# Patient Record
Sex: Female | Born: 1940 | Race: White | Hispanic: No | Marital: Married | Smoking: Former smoker
Health system: Southern US, Community
[De-identification: ages and names within clinical notes are randomized; demographics above are authoritative.]

## PROBLEM LIST (undated history)

## (undated) DIAGNOSIS — C801 Malignant (primary) neoplasm, unspecified: Secondary | ICD-10-CM

## (undated) DIAGNOSIS — I10 Essential (primary) hypertension: Secondary | ICD-10-CM

## (undated) DIAGNOSIS — I499 Cardiac arrhythmia, unspecified: Secondary | ICD-10-CM

## (undated) DIAGNOSIS — L570 Actinic keratosis: Secondary | ICD-10-CM

## (undated) DIAGNOSIS — N189 Chronic kidney disease, unspecified: Secondary | ICD-10-CM

## (undated) DIAGNOSIS — Z87442 Personal history of urinary calculi: Secondary | ICD-10-CM

## (undated) HISTORY — PX: TUBAL LIGATION: SHX77

## (undated) HISTORY — PX: TONSILLECTOMY: SUR1361

---
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
## 2011-06-05 ENCOUNTER — Ambulatory Visit: Payer: Self-pay | Admitting: Specialist

## 2011-06-09 ENCOUNTER — Ambulatory Visit: Payer: Self-pay | Admitting: Specialist

## 2011-06-09 LAB — BASIC METABOLIC PANEL
Anion Gap: 7 (ref 7–16)
BUN: 18 mg/dL (ref 7–18)
Chloride: 106 mmol/L (ref 98–107)
Co2: 28 mmol/L (ref 21–32)
Creatinine: 0.61 mg/dL (ref 0.60–1.30)
EGFR (African American): >60
Glucose: 83 mg/dL (ref 65–99)
Potassium: 3.9 mmol/L (ref 3.5–5.1)
Sodium: 141 mmol/L (ref 136–145)

## 2011-06-09 LAB — CBC WITH DIFFERENTIAL/PLATELET
Basophil %: 0.5 %
Eosinophil #: 0.1 10*3/uL (ref 0.0–0.7)
HCT: 42.2 % (ref 35.0–47.0)
HGB: 14 g/dL (ref 12.0–16.0)
Lymphocyte %: 31.1 %
MCH: 30.1 pg (ref 26.0–34.0)
MCHC: 33.1 g/dL (ref 32.0–36.0)
Monocyte #: 0.6 10*3/uL (ref 0.0–0.7)
Monocyte %: 8.1 %
Neutrophil #: 4.1 10*3/uL (ref 1.4–6.5)

## 2011-06-14 ENCOUNTER — Ambulatory Visit: Payer: Self-pay | Admitting: Specialist

## 2012-03-27 HISTORY — PX: SHOULDER ARTHROSCOPY: SHX128

## 2013-11-24 ENCOUNTER — Encounter: Payer: Self-pay | Admitting: Internal Medicine

## 2014-03-09 ENCOUNTER — Encounter: Payer: Self-pay | Admitting: Internal Medicine

## 2014-07-19 NOTE — Op Note (Signed)
PATIENT NAME:  [name], [name] MR#:  [id number] DATE OF BIRTH:  02-22-1941  DATE OF PROCEDURE:  06/14/2011  PREOPERATIVE DIAGNOSES:  1. Severe impingement of right shoulder with large anterior acromial spur.  2. Severe AC joint arthritis.  3. 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 4. Synovitis of right glenohumeral joint.   POSTOPERATIVE DIAGNOSES:  1. Severe impingement of right shoulder with large anterior acromial spur.  2. Severe AC joint arthritis.  3. 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 4. Synovitis of right glenohumeral joint.   PROCEDURES PERFORMED:  1. Arthroscopic resection of right distal clavicle.  2. Arthroscopic subacromial decompression, right shoulder, with removal of large spur.  3. Arthroscopic debridement of the glenohumeral joint.   SURGEON: Park Breed, M.D.   ANESTHESIA: General endotracheal plus interscalene block.  COMPLICATIONS: None.   DRAINS: None.   ESTIMATED BLOOD LOSS: Minimal.  REPLACEMENTS: None.   OPERATIVE FINDINGS: The patient had an intact rotator cuff on the joint side. She had some fraying on the bursal side, but no tear noted. The glenohumeral region showed an intact biceps. There was grade III chondromalacia on the humeral head. The glenoid had minimal changes. There was some synovitis. The labrum had minimal fraying. The subacromial space showed severe advanced bursitis. There was a large anterior acromial spur. There was significant spurring off the [facility] joint.   DESCRIPTION OF PROCEDURE: The patient was brought to the Operating Room where she underwent satisfactory general endotracheal anesthesia after an interscalene block was put in place. She was turned to the left lateral decubitus position and padded appropriately on a beanbag. The right shoulder was prepped and draped in sterile fashion and 10 pounds of traction applied. Arthroscopy was carried  from posterolateral and anterior portals. The above findings were encountered. The glenohumeral joint was carefully debrided with a motorized resector removing frayed tissue. The undersurface of the cuff was explored carefully and was in pristine condition. The arthroscope was redirected in the subacromial space where there was extensive bursitis. This was excised with motorized resector and ArthroCare wand. Soft tissue was debrided off the underside of the acromion and the large bur was used to remove the significant prominence of the anterior acromion. The coracoacromial ligament was debrided fully. The soft tissues were debrided from underneath the distal clavicle and the bur used to remove the entire portion of the distal clavicle. This was done laterally and anteriorly. The bursal surface the rotator cuff was just gently debrided where it was roughened up. After thorough debridement and decompression were carried out, the joint was thoroughly irrigated and the stab wounds closed with 3-0 Vicryl. 0.25% Marcaine with epinephrine and morphine was placed in the bursa, in the joint. A dry sterile dressing and TENS pads were applied. The ice pack and sling were applied. The patient was taken out of traction. She was awakened and taken to the Recovery Room in good condition.  ____________________________ Park Breed, MD hem:slb D: 06/14/2011 15:09:07 ET T: 06/14/2011 15:53:10 ET JOB#: [id number]  cc: Park Breed, MD, <Dictator> Park Breed MD ELECTRONICALLY SIGNED 06/14/2011 16:12

## 2014-08-06 ENCOUNTER — Ambulatory Visit: Payer: Medicare HMO | Admitting: Anesthesiology

## 2014-08-06 ENCOUNTER — Encounter: Payer: Self-pay | Admitting: *Deleted

## 2014-08-06 ENCOUNTER — Encounter
Admission: RE | Disposition: A | Discharge status: Home / Self Care | Payer: Self-pay | Source: Ambulatory Visit | Attending: Unknown Physician Specialty

## 2014-08-06 ENCOUNTER — Ambulatory Visit
Admission: RE | Admit: 2014-08-06 | Discharge: 2014-08-06 | Disposition: A | Discharge status: Home / Self Care | Payer: Medicare HMO | Source: Ambulatory Visit | Attending: Unknown Physician Specialty | Admitting: Unknown Physician Specialty

## 2014-08-06 DIAGNOSIS — Z1211 Encounter for screening for malignant neoplasm of colon: Secondary | ICD-10-CM | POA: Diagnosis not present

## 2014-08-06 DIAGNOSIS — I499 Cardiac arrhythmia, unspecified: Secondary | ICD-10-CM | POA: Diagnosis not present

## 2014-08-06 DIAGNOSIS — I10 Essential (primary) hypertension: Secondary | ICD-10-CM | POA: Insufficient documentation

## 2014-08-06 DIAGNOSIS — K640 First degree hemorrhoids: Secondary | ICD-10-CM | POA: Insufficient documentation

## 2014-08-06 DIAGNOSIS — Z79899 Other long term (current) drug therapy: Secondary | ICD-10-CM | POA: Insufficient documentation

## 2014-08-06 DIAGNOSIS — K5730 Diverticulosis of large intestine without perforation or abscess without bleeding: Secondary | ICD-10-CM | POA: Diagnosis not present

## 2014-08-06 HISTORY — PX: COLONOSCOPY: SHX5424

## 2014-08-06 HISTORY — DX: Cardiac arrhythmia, unspecified: I49.9

## 2014-08-06 HISTORY — DX: Essential (primary) hypertension: I10

## 2014-08-06 SURGERY — COLONOSCOPY
Anesthesia: Monitor Anesthesia Care

## 2014-08-06 MED ORDER — PROPOFOL INFUSION 10 MG/ML OPTIME
INTRAVENOUS | Status: DC | PRN
  Administered 2014-08-06: 140 ug/kg/min via INTRAVENOUS

## 2014-08-06 MED ORDER — SODIUM CHLORIDE 0.9 % IV SOLN: INTRAVENOUS | Status: DC

## 2014-08-06 MED ORDER — FENTANYL CITRATE (PF) 100 MCG/2ML IJ SOLN
INTRAMUSCULAR | Status: DC | PRN
  Administered 2014-08-06: 50 ug via INTRAVENOUS

## 2014-08-06 MED ORDER — SODIUM CHLORIDE 0.9 % IV SOLN
INTRAVENOUS | Status: DC
  Administered 2014-08-06: 15:00:00 via INTRAVENOUS

## 2014-08-06 MED ORDER — GLYCOPYRROLATE 0.2 MG/ML IJ SOLN
INTRAMUSCULAR | Status: DC | PRN
Start: 2014-08-06 — End: 2014-08-06
  Administered 2014-08-06: 0.4 mg via INTRAVENOUS

## 2014-08-06 NOTE — Op Note (Signed)
[facility] Gastroenterology Patient Name: [name] Procedure Date: 08/06/2014 3:23 PM MRN: [id number] Account #: [id number] Date of Birth: 08/09/1940 Admit Type: Outpatient Age: 74 Room: [facility] ENDO ROOM 3 Gender: Female Note Status: Finalized Procedure:         Colonoscopy Indications:       Screening for colorectal malignant neoplasm Providers:         Manya Silvas, MD Referring MD:      Perrin Maltese, MD (Referring MD) Medicines:         Propofol per Anesthesia Complications:     No immediate complications. Procedure:         Pre-Anesthesia Assessment:                    - After reviewing the risks and benefits, the patient was                     deemed in satisfactory condition to undergo the procedure.                    After obtaining informed consent, the colonoscope was                     passed under direct vision. Throughout the procedure, the                     patient's blood pressure, pulse, and oxygen saturations                     were monitored continuously. The Olympus PCF-H180AL                     colonoscope ( S#: [id number] ) was introduced through the                     anus and advanced to the the cecum, identified by                     appendiceal orifice and ileocecal valve. The colonoscopy                     was somewhat difficult due to a tortuous colon. Successful                     completion of the procedure was aided by applying                     abdominal pressure. During the procedure the patient had                     to be repositioned. Patient changed from L lateral to                     supine and this helped. Findings:      Multiple small-mouthed diverticula were found in the sigmoid colon and       in the descending colon.      Internal hemorrhoids were found during endoscopy. The hemorrhoids were       small and Grade I (internal hemorrhoids that do not prolapse).      No polyps seen. Impression:         - Diverticulosis in the sigmoid colon and in the  descending colon.                    - Internal hemorrhoids.                    - No specimens collected. Recommendation:    - The findings and recommendations were discussed with the                     patient's family. Manya Silvas, MD 08/06/2014 3:55:22 PM This report has been signed electronically. Number of Addenda: 0 Note Initiated On: 08/06/2014 3:23 PM Scope Withdrawal Time: 0 hours 7 minutes 55 seconds  Total Procedure Duration: 0 hours 20 minutes 17 seconds       [facility]

## 2014-08-06 NOTE — Anesthesia Procedure Notes (Signed)
Procedure Name: MAC Performed by: Jonna Clark Pre-anesthesia Checklist: Patient identified, Emergency Drugs available, Suction available, Patient being monitored and Timeout performed Patient Re-evaluated:Patient Re-evaluated prior to inductionOxygen Delivery Method: Nasal cannula Preoxygenation: Pre-oxygenation with 100% oxygen

## 2014-08-06 NOTE — Anesthesia Postprocedure Evaluation (Signed)
  Anesthesia Post-op Note  Patient: [name]  Procedure(s) Performed: Procedure(s): COLONOSCOPY (N/A)  Anesthesia type:MAC  Patient locationjEndo  Post pain: Pain level controlled  Post assessment: Post-op Vital signs reviewed, Patient's Cardiovascular Status Stable, Respiratory Function Stable, Patent Airway and No signs of Nausea or vomiting  Post vital signs: Reviewed and stable  Last Vitals:  Filed Vitals:   08/06/14 1558  BP: 107/55  Pulse: 74  Temp: 36.8 C  Resp: 15    Level of consciousness: awake, alert  and patient cooperative  Complications: No apparent anesthesia complications

## 2014-08-06 NOTE — H&P (Signed)
     Primary Care Physician:  Perrin Maltese, MD Primary Gastroenterologist:  Dr. Vira Agar  Pre-Procedure History & Physical: HPI:  [name] is a 74 y.o. female is here for a screening colonoscopy.   Past Medical History  Diagnosis Date  . Hypertension   . Dysrhythmia     Past Surgical History  Procedure Laterality Date  . Tonsillectomy    . Tubal ligation      Prior to Admission medications   Medication Sig Start Date End Date Taking? Authorizing Provider  cholecalciferol (VITAMIN D) 1000 UNITS tablet Take 1,000 Units by mouth daily.   Yes Historical Provider, MD  diphenhydrAMINE (BENADRYL) 25 MG tablet Take 50 mg by mouth every 6 (six) hours as needed for allergies.   Yes Historical Provider, MD  ibuprofen (ADVIL,MOTRIN) 200 MG tablet Take 400 mg by mouth every 6 (six) hours as needed for headache or moderate pain.   Yes Historical Provider, MD  lisinopril (PRINIVIL,ZESTRIL) 20 MG tablet Take 20 mg by mouth daily.   Yes Historical Provider, MD  lovastatin (MEVACOR) 20 MG tablet Take 20 mg by mouth at bedtime.   Yes Historical Provider, MD  metoprolol (LOPRESSOR) 50 MG tablet Take 50 mg by mouth 2 (two) times daily.   Yes Historical Provider, MD    Allergies as of 07/10/2014  . (Not on File)     Review of Systems: See HPI, otherwise negative ROS  Physical Exam: BP 157/70 mmHg  Pulse 60  Temp(Src) 97.8 F (36.6 C) (Tympanic)  Resp 18  Ht 5\' 6"  (1.676 m)  Wt 77.111 kg (170 lb)  BMI 27.45 kg/m2  SpO2 100% General:   Alert,  pleasant and cooperative in NAD Head:  Normocephalic and atraumatic. Neck:  Supple; no masses or thyromegaly. Lungs:  Clear throughout to auscultation.    Heart:  Regular rate and rhythm. Abdomen:  Soft, nontender and nondistended. Normal bowel sounds, without guarding, and without rebound.   Neurologic:  Alert and  oriented x4;  grossly normal neurologically.  Impression/Plan: [name] is now here to undergo a screening  colonoscopy.  Risks, benefits, and alternatives regarding colonoscopy have been reviewed with the patient.  Questions have been answered.  All parties agreeable.   Gaylyn Cheers , MD

## 2014-08-06 NOTE — Transfer of Care (Signed)
Immediate Anesthesia Transfer of Care Note  Patient: [name]  Procedure(s) Performed: Procedure(s): COLONOSCOPY (N/A)  Patient Location: PACU and Endoscopy Unit  Anesthesia Type:MAC  Level of Consciousness: awake, alert  and oriented  Airway & Oxygen Therapy: Patient Spontanous Breathing and Patient connected to nasal cannula oxygen  Post-op Assessment: Report given to RN and Post -op Vital signs reviewed and stable  Post vital signs: Reviewed and stable  Last Vitals:  Filed Vitals:   08/06/14 1422  BP: 157/70  Pulse: 60  Temp: 36.6 C  Resp: 18    Complications: No apparent anesthesia complications

## 2014-08-06 NOTE — Anesthesia Preprocedure Evaluation (Signed)
Anesthesia Evaluation  Patient identified by MRN, date of birth, ID band  Reviewed: Allergy & Precautions, H&P , NPO status , Patient's Chart, lab work & pertinent test results, reviewed documented beta blocker date and time   Airway Mallampati: II  TM Distance: >3 FB Neck ROM: full    Dental   Pulmonary neg pulmonary ROS, former smoker,  breath sounds clear to auscultation  Pulmonary exam normal       Cardiovascular hypertension, Rhythm:regular Rate:Normal     Neuro/Psych negative neurological ROS  negative psych ROS   GI/Hepatic negative GI ROS, Neg liver ROS,   Endo/Other  negative endocrine ROS  Renal/GU negative Renal ROS  negative genitourinary   Musculoskeletal   Abdominal   Peds  Hematology negative hematology ROS (+)   Anesthesia Other Findings   Reproductive/Obstetrics negative OB ROS                             Anesthesia Physical Anesthesia Plan  ASA: II  Anesthesia Plan: MAC   Post-op Pain Management:    Induction:   Airway Management Planned:   Additional Equipment:   Intra-op Plan:   Post-operative Plan:   Informed Consent: I have reviewed the patients History and Physical, chart, labs and discussed the procedure including the risks, benefits and alternatives for the proposed anesthesia with the patient or authorized representative who has indicated his/her understanding and acceptance.   Dental Advisory Given  Plan Discussed with: CRNA and Surgeon  Anesthesia Plan Comments:         Anesthesia Quick Evaluation

## 2014-08-07 ENCOUNTER — Encounter: Payer: Self-pay | Admitting: Unknown Physician Specialty

## 2015-04-22 DIAGNOSIS — I10 Essential (primary) hypertension: Secondary | ICD-10-CM | POA: Diagnosis not present

## 2015-04-22 DIAGNOSIS — I340 Nonrheumatic mitral (valve) insufficiency: Secondary | ICD-10-CM | POA: Diagnosis not present

## 2015-04-22 DIAGNOSIS — E782 Mixed hyperlipidemia: Secondary | ICD-10-CM | POA: Diagnosis not present

## 2015-04-22 DIAGNOSIS — R002 Palpitations: Secondary | ICD-10-CM | POA: Diagnosis not present

## 2015-06-01 DIAGNOSIS — H2513 Age-related nuclear cataract, bilateral: Secondary | ICD-10-CM | POA: Diagnosis not present

## 2015-06-01 DIAGNOSIS — H5203 Hypermetropia, bilateral: Secondary | ICD-10-CM | POA: Diagnosis not present

## 2015-06-01 DIAGNOSIS — H43313 Vitreous membranes and strands, bilateral: Secondary | ICD-10-CM | POA: Diagnosis not present

## 2015-06-29 DIAGNOSIS — J301 Allergic rhinitis due to pollen: Secondary | ICD-10-CM | POA: Diagnosis not present

## 2015-06-29 DIAGNOSIS — E782 Mixed hyperlipidemia: Secondary | ICD-10-CM | POA: Diagnosis not present

## 2015-06-29 DIAGNOSIS — I10 Essential (primary) hypertension: Secondary | ICD-10-CM | POA: Diagnosis not present

## 2015-07-13 DIAGNOSIS — H3561 Retinal hemorrhage, right eye: Secondary | ICD-10-CM | POA: Diagnosis not present

## 2015-08-19 DIAGNOSIS — I10 Essential (primary) hypertension: Secondary | ICD-10-CM | POA: Diagnosis not present

## 2015-08-19 DIAGNOSIS — E782 Mixed hyperlipidemia: Secondary | ICD-10-CM | POA: Diagnosis not present

## 2015-08-19 DIAGNOSIS — R002 Palpitations: Secondary | ICD-10-CM | POA: Diagnosis not present

## 2015-12-30 DIAGNOSIS — I10 Essential (primary) hypertension: Secondary | ICD-10-CM | POA: Diagnosis not present

## 2015-12-30 DIAGNOSIS — J3089 Other allergic rhinitis: Secondary | ICD-10-CM | POA: Diagnosis not present

## 2015-12-30 DIAGNOSIS — E782 Mixed hyperlipidemia: Secondary | ICD-10-CM | POA: Diagnosis not present

## 2016-03-03 DIAGNOSIS — Z1231 Encounter for screening mammogram for malignant neoplasm of breast: Secondary | ICD-10-CM | POA: Diagnosis not present

## 2017-06-25 DIAGNOSIS — C801 Malignant (primary) neoplasm, unspecified: Secondary | ICD-10-CM

## 2017-06-25 HISTORY — DX: Malignant (primary) neoplasm, unspecified: C80.1

## 2017-07-10 DIAGNOSIS — C4492 Squamous cell carcinoma of skin, unspecified: Secondary | ICD-10-CM

## 2017-07-10 HISTORY — DX: Squamous cell carcinoma of skin, unspecified: C44.92

## 2017-09-19 ENCOUNTER — Telehealth: Payer: Self-pay | Admitting: Urology

## 2017-09-19 NOTE — Telephone Encounter (Signed)
Called patient and told her she needed to get the CT scan put on a CD and bring it to her app or we would need to reschd it. She said she would call her PCP and have this done.  [name]

## 2017-09-19 NOTE — Telephone Encounter (Signed)
I need [name] to bring a disc of her CT scan with her to her appointment.

## 2017-09-19 NOTE — Progress Notes (Signed)
09/20/2017 1:54 PM   [name] [birth year] [id number]  Referring provider: Perrin Maltese, MD [place], [place] [address]  Chief Complaint  Patient presents with  . Nephrolithiasis    HPI: Patient is a 77 year old Caucasian female who is referred by Dr. Humphrey Rolls for nephrolithiasis.    Today, she is experiencing nocturia and incontinence.  Patient denies any gross hematuria, dysuria or suprapubic/flank pain.  Patient denies any fevers, chills, nausea or vomiting.   Her UA today is positive for >30 WBC's, 11-30 RBC's, moderate bacteria and uric acid crystals.    CT scan in 08/2017 at [facility] noted multiple left renal calculi and a 2.6 cm renal pelvic calculus and left renal pyelocaliectasis.  Serum creatinine was 1.07 on 09/11/2017.  Her serum creatinine was 0.61 in 2013.    She states that she was told she had stones in her kidneys 5 years ago, but they were just sand.      PMH: Past Medical History:  Diagnosis Date  . Dysrhythmia   . Hypertension     Surgical History: Past Surgical History:  Procedure Laterality Date  . COLONOSCOPY N/A 08/06/2014   Procedure: COLONOSCOPY;  Surgeon: Manya Silvas, MD;  Location: [facility] ENDOSCOPY;  Service: Endoscopy;  Laterality: N/A;  . TONSILLECTOMY    . TUBAL LIGATION      Home Medications:  Allergies as of 09/20/2017      Reactions   Raloxifene    Other reaction(s): Other (See Comments) hotflashes      Medication List        Accurate as of 09/20/17  1:54 PM. Always use your most recent med list.          cholecalciferol 1000 units tablet Commonly known as:  VITAMIN D Take 1,000 Units by mouth daily.   ibuprofen 200 MG tablet Commonly known as:  ADVIL,MOTRIN Take 400 mg by mouth every 6 (six) hours as needed for headache or moderate pain.   lisinopril 20 MG tablet Commonly known as:  PRINIVIL,ZESTRIL Take 20 mg by mouth daily.   lovastatin 20 MG tablet Commonly known as:  MEVACOR Take  20 mg by mouth at bedtime.   metoprolol tartrate 50 MG tablet Commonly known as:  LOPRESSOR Take 50 mg by mouth 2 (two) times daily.       Allergies:  Allergies  Allergen Reactions  . Raloxifene     Other reaction(s): Other (See Comments) hotflashes    Family History: History reviewed. No pertinent family history.  Social History:  reports that she has quit smoking. She has never used smokeless tobacco. She reports that she drinks about 4.2 oz of alcohol per week. She reports that she does not use drugs.  ROS: UROLOGY Frequent Urination?: No Hard to postpone urination?: No Burning/pain with urination?: No Get up at night to urinate?: Yes Leakage of urine?: Yes Urine stream starts and stops?: No Trouble starting stream?: No Do you have to strain to urinate?: No Blood in urine?: Yes Urinary tract infection?: No Sexually transmitted disease?: No Injury to kidneys or bladder?: No Painful intercourse?: No Weak stream?: No Currently pregnant?: No Vaginal bleeding?: No Last menstrual period?: n  Gastrointestinal Nausea?: No Vomiting?: No Indigestion/heartburn?: No Diarrhea?: Yes Constipation?: No  Constitutional Fever: No Night sweats?: No Weight loss?: No Fatigue?: No  Skin Skin rash/lesions?: No Itching?: No  Eyes Blurred vision?: No  Ears/Nose/Throat Sore throat?: No Sinus problems?: Yes  Hematologic/Lymphatic Swollen glands?: No Easy bruising?:  Yes  Cardiovascular Leg swelling?: No Chest pain?: No  Respiratory Cough?: No Shortness of breath?: No  Endocrine Excessive thirst?: No  Musculoskeletal Back pain?: No Joint pain?: No  Neurological Headaches?: No Dizziness?: No  Psychologic Depression?: No Anxiety?: No  Physical Exam: BP 122/75   Pulse 64   Resp 16   Ht 5\' 4"  (1.626 m)   Wt 150 lb 6.4 oz (68.2 kg)   SpO2 96%   BMI 25.82 kg/m   Constitutional:  Well nourished. Alert and oriented, No acute distress. HEENT: [place] AT,  moist mucus membranes.  Trachea midline, no masses. Cardiovascular: No clubbing, cyanosis, or edema. Respiratory: Normal respiratory effort, no increased work of breathing. GI: Abdomen is soft, non tender, non distended, no abdominal masses. Liver and spleen not palpable.  No hernias appreciated.  Stool sample for occult testing is not indicated.   GU: No CVA tenderness.  No bladder fullness or masses.   Skin: No rashes, bruises or suspicious lesions. Lymph: No cervical or inguinal adenopathy. Neurologic: Grossly intact, no focal deficits, moving all 4 extremities. Psychiatric: Normal mood and affect.  Laboratory Data: Lab Results  Component Value Date   WBC 7.0 06/09/2011   HGB 14.0 06/09/2011   HCT 42.2 06/09/2011   MCV 91 06/09/2011   PLT 182 06/09/2011    Lab Results  Component Value Date   CREATININE 0.61 06/09/2011    No results found for: PSA  No results found for: TESTOSTERONE  No results found for: HGBA1C  No results found for: TSH  No results found for: CHOL, HDL, CHOLHDL, VLDL, LDLCALC  No results found for: AST No results found for: ALT No components found for: ALKALINEPHOPHATASE No components found for: BILIRUBINTOTAL  No results found for: ESTRADIOL  Urinalysis No results found for: COLORURINE, APPEARANCEUR, LABSPEC, PHURINE, GLUCOSEU, HGBUR, BILIRUBINUR, KETONESUR, PROTEINUR, UROBILINOGEN, NITRITE, LEUKOCYTESUR  I have reviewed the labs.   Pertinent Imaging: Patient with a very large stone in the left UPJ and four large stones in the left kidney with marked hydronephrosis.     I have independently reviewed the films with Dr. Erlene Quan  Assessment & Plan:    1. Left renal stones The functional status of the left kidney is questionable.  We will need to decompress the kidney by placing a nephrostomy tube and relieve the obstruction.  After the tube has been in place for one week, we will proceed with a nm renal function scan.   Advised to contact our  office or seek treatment in the ED if becomes febrile or pain/ vomiting are difficult control in order to arrange for emergent/urgent intervention  2. Left hydronephrosis See above   Return for I will call patient with results.  These notes generated with voice recognition software. I apologize for typographical errors.  Zara Council, PA-C  [facility] [address]  [place], [place] [address] [phone]

## 2017-09-20 ENCOUNTER — Telehealth: Payer: Self-pay | Admitting: Urology

## 2017-09-20 ENCOUNTER — Encounter: Payer: Self-pay | Admitting: Urology

## 2017-09-20 ENCOUNTER — Ambulatory Visit: Payer: Medicare HMO | Admitting: Urology

## 2017-09-20 VITALS — BP 122/75 | HR 64 | Resp 16 | Ht 64.0 in | Wt 150.4 lb

## 2017-09-20 DIAGNOSIS — N200 Calculus of kidney: Secondary | ICD-10-CM | POA: Diagnosis not present

## 2017-09-20 NOTE — Telephone Encounter (Signed)
Please schedule [name] for a percutaneous drainage of her left kidney.

## 2017-09-21 ENCOUNTER — Other Ambulatory Visit: Payer: Self-pay | Admitting: Radiology

## 2017-09-21 DIAGNOSIS — N200 Calculus of kidney: Secondary | ICD-10-CM

## 2017-09-21 DIAGNOSIS — N1330 Unspecified hydronephrosis: Secondary | ICD-10-CM

## 2017-09-21 LAB — MICROSCOPIC EXAMINATION

## 2017-09-21 LAB — URINALYSIS, COMPLETE
Bilirubin, UA: NEGATIVE
Glucose, UA: NEGATIVE
Ketones, UA: NEGATIVE
Nitrite, UA: NEGATIVE
PH UA: 5 (ref 5.0–7.5)
Protein, UA: NEGATIVE
SPEC GRAV UA: 1.025 (ref 1.005–1.030)
UUROB: 0.2 mg/dL (ref 0.2–1.0)

## 2017-09-21 NOTE — Telephone Encounter (Signed)
Patient notified of left nephrostomy tube placement scheduled at 1:45 on 09/24/2017. Instructions given. Questions answered. Pt voices understanding.

## 2017-09-21 NOTE — Telephone Encounter (Signed)
[name] from [facility] [phone]) called the office today.  She needs orders placed for the percutaneous drainage tube of her left kidney.

## 2017-09-21 NOTE — Telephone Encounter (Signed)
See Lisa's note

## 2017-09-23 LAB — CULTURE, URINE COMPREHENSIVE

## 2017-09-24 ENCOUNTER — Ambulatory Visit
Admission: RE | Admit: 2017-09-24 | Discharge: 2017-09-24 | Disposition: A | Discharge status: Home / Self Care | Payer: Medicare HMO | Source: Ambulatory Visit | Attending: Urology | Admitting: Urology

## 2017-09-24 ENCOUNTER — Ambulatory Visit: Admission: RE | Admit: 2017-09-24 | Payer: Medicare HMO | Source: Ambulatory Visit

## 2017-09-24 DIAGNOSIS — N132 Hydronephrosis with renal and ureteral calculous obstruction: Secondary | ICD-10-CM | POA: Diagnosis not present

## 2017-09-24 DIAGNOSIS — I10 Essential (primary) hypertension: Secondary | ICD-10-CM | POA: Diagnosis not present

## 2017-09-24 DIAGNOSIS — N200 Calculus of kidney: Secondary | ICD-10-CM

## 2017-09-24 DIAGNOSIS — N1330 Unspecified hydronephrosis: Secondary | ICD-10-CM

## 2017-09-24 DIAGNOSIS — Z87891 Personal history of nicotine dependence: Secondary | ICD-10-CM | POA: Insufficient documentation

## 2017-09-24 DIAGNOSIS — N189 Chronic kidney disease, unspecified: Secondary | ICD-10-CM

## 2017-09-24 DIAGNOSIS — N261 Atrophy of kidney (terminal): Secondary | ICD-10-CM | POA: Insufficient documentation

## 2017-09-24 HISTORY — PX: IR NEPHROSTOMY PLACEMENT LEFT: IMG6063

## 2017-09-24 HISTORY — DX: Chronic kidney disease, unspecified: N18.9

## 2017-09-24 LAB — CBC
HCT: 42.5 % (ref 35.0–47.0)
Hemoglobin: 14.3 g/dL (ref 12.0–16.0)
MCH: 31.2 pg (ref 26.0–34.0)
MCHC: 33.7 g/dL (ref 32.0–36.0)
MCV: 92.6 fL (ref 80.0–100.0)
PLATELETS: 176 10*3/uL (ref 150–440)
RBC: 4.59 MIL/uL (ref 3.80–5.20)
RDW: 13.5 % (ref 11.5–14.5)
WBC: 6.2 10*3/uL (ref 3.6–11.0)

## 2017-09-24 LAB — PROTIME-INR
INR: 0.94
Prothrombin Time: 12.5 seconds (ref 11.4–15.2)

## 2017-09-24 MED ORDER — LIDOCAINE HCL (PF) 1 % IJ SOLN
INTRAMUSCULAR | Status: AC | PRN
  Administered 2017-09-24: 5 mL

## 2017-09-24 MED ORDER — SODIUM CHLORIDE FLUSH 0.9 % IV SOLN
INTRAVENOUS | Status: AC
  Filled 2017-09-24: qty 100

## 2017-09-24 MED ORDER — FENTANYL CITRATE (PF) 100 MCG/2ML IJ SOLN
INTRAMUSCULAR | Status: AC
  Filled 2017-09-24: qty 4

## 2017-09-24 MED ORDER — SODIUM CHLORIDE 0.9% FLUSH: 5.0000 mL | Freq: Three times a day (TID) | INTRAVENOUS | Status: DC

## 2017-09-24 MED ORDER — CIPROFLOXACIN IN D5W 400 MG/200ML IV SOLN
400.0000 mg | Freq: Once | INTRAVENOUS | Status: AC
  Administered 2017-09-24: 400 mg via INTRAVENOUS
  Filled 2017-09-24: qty 200

## 2017-09-24 MED ORDER — MIDAZOLAM HCL 5 MG/5ML IJ SOLN
INTRAMUSCULAR | Status: AC | PRN
  Administered 2017-09-24: 0.5 mg via INTRAVENOUS
  Administered 2017-09-24: 1 mg via INTRAVENOUS
  Administered 2017-09-24: 0.5 mg via INTRAVENOUS
  Administered 2017-09-24: 1 mg via INTRAVENOUS

## 2017-09-24 MED ORDER — SODIUM CHLORIDE 0.9 % IV SOLN
INTRAVENOUS | Status: DC
  Administered 2017-09-24: 14:00:00 via INTRAVENOUS

## 2017-09-24 MED ORDER — MIDAZOLAM HCL 5 MG/5ML IJ SOLN
INTRAMUSCULAR | Status: AC
  Filled 2017-09-24: qty 5

## 2017-09-24 MED ORDER — CIPROFLOXACIN IN D5W 400 MG/200ML IV SOLN
INTRAVENOUS | Status: AC
  Filled 2017-09-24: qty 200

## 2017-09-24 MED ORDER — FENTANYL CITRATE (PF) 100 MCG/2ML IJ SOLN
INTRAMUSCULAR | Status: AC | PRN
  Administered 2017-09-24 (×2): 25 ug via INTRAVENOUS
  Administered 2017-09-24: 50 ug via INTRAVENOUS

## 2017-09-24 MED ORDER — IOPAMIDOL (ISOVUE-300) INJECTION 61%
30.0000 mL | Freq: Once | INTRAVENOUS | Status: AC | PRN
  Administered 2017-09-24: 10 mL

## 2017-09-24 NOTE — Procedures (Signed)
L 10 Fr PCN EBL 0 Comp 0

## 2017-09-24 NOTE — H&P (Signed)
Chief Complaint: Patient was seen in consultation today for No chief complaint on file.  at the request of Paoli A  Referring Physician(s): McGowan,Shannon A  Supervising Physician: Marybelle Killings  Patient Status: [facility] - Out-pt  History of Present Illness: [name] is a 77 y.o. female with left hydronephrosis due to renal calculi. There is atrophy of the left kidney. After PCN, she will undergo a renal scan to determine function of the left kidney. She denies fevers and chills.  Past Medical History:  Diagnosis Date  . Dysrhythmia    PVCs  . Hypertension     Past Surgical History:  Procedure Laterality Date  . COLONOSCOPY N/A 08/06/2014   Procedure: COLONOSCOPY;  Surgeon: Manya Silvas, MD;  [facility] ENDOSCOPY;  Service: Endoscopy;  Laterality: N/A;  . TONSILLECTOMY    . TUBAL LIGATION      Allergies: Raloxifene  Medications: Prior to Admission medications   Medication Sig Start Date End Date Taking? Authorizing Provider  cholecalciferol (VITAMIN D) 1000 UNITS tablet Take 1,000 Units by mouth daily.   Yes [provider]  ibuprofen (ADVIL,MOTRIN) 200 MG tablet Take 400 mg by mouth every 6 (six) hours as needed for headache or moderate pain.   Yes [provider]  lisinopril (PRINIVIL,ZESTRIL) 20 MG tablet Take 20 mg by mouth daily.   Yes [provider]  lovastatin (MEVACOR) 20 MG tablet Take 20 mg by mouth at bedtime.   Yes [provider]  metoprolol (LOPRESSOR) 50 MG tablet Take 50 mg by mouth 2 (two) times daily.   Yes [provider]     History reviewed. No pertinent family history.  Social History   Socioeconomic History  . Marital status: Married    Spouse name: Not on file  . Number of children: Not on file  . Years of education: Not on file  . Highest education level: Not on file  Occupational History  . Not on file  Social Needs  . Financial resource strain: Not on file  . Food  insecurity:    Worry: Not on file    Inability: Not on file  . Transportation needs:    Medical: Not on file    Non-medical: Not on file  Tobacco Use  . Smoking status: Former Smoker    Last attempt to quit: 03/28/1975    Years since quitting: 42.5  . Smokeless tobacco: Never Used  Substance and Sexual Activity  . Alcohol use: Yes    Alcohol/week: 4.2 oz    Types: 7 Glasses of wine per week  . Drug use: No  . Sexual activity: Not on file  Lifestyle  . Physical activity:    Days per week: Not on file    Minutes per session: Not on file  . Stress: Not on file  Relationships  . Social connections:    Talks on phone: Not on file    Gets together: Not on file    Attends religious service: Not on file    Active member of club or organization: Not on file    Attends meetings of clubs or organizations: Not on file    Relationship status: Not on file  Other Topics Concern  . Not on file  Social History Narrative  . Not on file     Review of Systems: A 12 point ROS discussed and pertinent positives are indicated in the HPI above.  All other systems are negative.  Review of Systems  Vital Signs:  BP (!) 151/77   Pulse 61   Temp 98.4 F (36.9 C) (Oral)   Resp 15   Ht 5\' 4"  (1.626 m)   Wt 150 lb (68 kg)   SpO2 97%   BMI 25.75 kg/m   Physical Exam  Imaging: No results found.  Labs:  CBC: Recent Labs    09/24/17 1418  WBC 6.2  HGB 14.3  HCT 42.5  PLT 176    COAGS: Recent Labs    09/24/17 1418  INR 0.94    BMP: No results for input(s): NA, K, CL, CO2, GLUCOSE, BUN, CALCIUM, CREATININE, GFRNONAA, GFRAA in the last 8760 hours.  Invalid input(s): CMP  LIVER FUNCTION TESTS: No results for input(s): BILITOT, AST, ALT, ALKPHOS, PROT, ALBUMIN in the last 8760 hours.  TUMOR MARKERS: No results for input(s): AFPTM, CEA, CA199, CHROMGRNA in the last 8760 hours.  Assessment and Plan:  Left PCN for left ureteral obstruction.  Thank you for this interesting  consult.  I greatly enjoyed meeting [name] and look forward to participating in their care.  A copy of this report was sent to the requesting provider on this date.  Electronically Signed: Rebeccah Ivins, ART A, MD 09/24/2017, 3:02 PM   I spent a total of  40 Minutes   in face to face in clinical consultation, greater than 50% of which was counseling/coordinating care for nephrostomy.

## 2017-09-25 ENCOUNTER — Encounter: Payer: Self-pay | Admitting: Interventional Radiology

## 2017-09-25 ENCOUNTER — Other Ambulatory Visit: Payer: Self-pay | Admitting: Urology

## 2017-09-25 ENCOUNTER — Telehealth: Payer: Self-pay | Admitting: Urology

## 2017-09-25 DIAGNOSIS — N1330 Unspecified hydronephrosis: Secondary | ICD-10-CM

## 2017-09-25 MED ORDER — SODIUM CHLORIDE 0.9 % IJ SOLN: 5.0000 mL | Freq: Three times a day (TID) | INTRAMUSCULAR | 0 refills | Status: DC

## 2017-09-25 NOTE — Telephone Encounter (Signed)
Pt called to get Rx for saline syringes.  She also wants to know if she can come to our office once a day to have this flushed.  They put tube in her kidney to drain bag she said.  Please give pt call [phone].

## 2017-09-25 NOTE — Telephone Encounter (Signed)
She can come to the office to have [place] flush her nephrostomy tube.

## 2017-09-25 NOTE — Progress Notes (Unsigned)
Orders are in for the function studies.  This is to be performed to evaluate for the function of the kidney.  This is NOT to check for obstruction.  Function only.

## 2017-09-25 NOTE — Telephone Encounter (Signed)
Patient notified and has appointment [place] heduled for nurse visit. Syringes have been ordered.

## 2017-09-26 ENCOUNTER — Ambulatory Visit (INDEPENDENT_AMBULATORY_CARE_PROVIDER_SITE_OTHER): Payer: Medicare HMO

## 2017-09-26 DIAGNOSIS — N1330 Unspecified hydronephrosis: Secondary | ICD-10-CM

## 2017-09-26 NOTE — Progress Notes (Signed)
Pt present today for post op Neph tube flush. Pt was prepped in a sterile fashion, a 5cc syringe was filled with saline and inserted into the left neph tube. Pt tolerated well.

## 2017-09-28 ENCOUNTER — Telehealth: Payer: Self-pay | Admitting: Urology

## 2017-09-28 NOTE — Telephone Encounter (Signed)
Patient needs her renal scan completed next week and it hasn't been scheduled yet.  Would you check on this for me?

## 2017-10-01 ENCOUNTER — Telehealth: Payer: Self-pay | Admitting: Urology

## 2017-10-01 NOTE — Telephone Encounter (Signed)
It has been sent to scheduling waiting on them to call her. She can call them to schedule at [place]

## 2017-10-01 NOTE — Telephone Encounter (Signed)
Do we have any updates on her renal scan appointment?

## 2017-10-01 NOTE — Telephone Encounter (Signed)
Pt LMOM asking for someone to call her to clarify when she is supposed to get the tubes in her kidney removed. She's ready to get them out. Please advise pt. Thanks.

## 2017-10-01 NOTE — Telephone Encounter (Signed)
Pt called again waiting for a return call to see what her next step will be since her procedure.  Please give pt a call [phone]

## 2017-10-02 NOTE — Telephone Encounter (Signed)
Amy is taking care of this for patient.

## 2017-10-04 ENCOUNTER — Ambulatory Visit
Admission: RE | Admit: 2017-10-04 | Discharge: 2017-10-04 | Disposition: A | Discharge status: Home / Self Care | Payer: Medicare HMO | Source: Ambulatory Visit | Attending: Urology | Admitting: Urology

## 2017-10-04 DIAGNOSIS — N1330 Unspecified hydronephrosis: Secondary | ICD-10-CM | POA: Insufficient documentation

## 2017-10-04 MED ORDER — FUROSEMIDE 10 MG/ML IJ SOLN
34.0000 mg | Freq: Once | INTRAMUSCULAR | Status: AC
  Administered 2017-10-04: 34 mg via INTRAVENOUS
  Filled 2017-10-04: qty 3.4

## 2017-10-04 MED ORDER — TECHNETIUM TC 99M MERTIATIDE
5.5000 | Freq: Once | INTRAVENOUS | Status: AC | PRN
  Administered 2017-10-04: 5.5 via INTRAVENOUS

## 2017-10-09 ENCOUNTER — Other Ambulatory Visit: Payer: Self-pay | Admitting: Radiology

## 2017-10-09 ENCOUNTER — Encounter: Payer: Self-pay | Admitting: Urology

## 2017-10-09 ENCOUNTER — Ambulatory Visit: Payer: Medicare HMO | Admitting: Urology

## 2017-10-09 VITALS — BP 135/77 | HR 77 | Ht 64.0 in | Wt 150.0 lb

## 2017-10-09 DIAGNOSIS — N261 Atrophy of kidney (terminal): Secondary | ICD-10-CM | POA: Diagnosis not present

## 2017-10-09 DIAGNOSIS — N200 Calculus of kidney: Secondary | ICD-10-CM

## 2017-10-09 DIAGNOSIS — N1330 Unspecified hydronephrosis: Secondary | ICD-10-CM | POA: Diagnosis not present

## 2017-10-09 NOTE — H&P (View-Only) (Signed)
10/09/2017 3:55 PM   [name] 01-22-1941 [id number]  Referring provider: Perrin Maltese, MD [place], [place] [address]  Chief Complaint  Patient presents with  . Results    NM study    HPI: 77 year old female who returns today to discuss management of her atrophic left kidney and obstructing stones.  She initially presented for further evaluation of nephrolithiasis and was seen by Zara Council.  She had a CT scan on 08/2017 at [facility] medical which noted multiple left renal calculi including 1.6 cm left renal pelvic calculus with pelviectasis.  Additionally, her serum creatinine is in from 0.61 and 2013 now up to 1.07 as of 09/11/2017.  She denies any urinary symptoms including no hematuria.  She does believe some occasional intermittent pain in her left lower quadrant but was unsure if this was related to her intestines kidney and never really had it evaluated.  She did have a personal history of kidney stones about 5 years ago but was told that the size of the stones were "sand".  On the appearance of her kidney on CT scan and severe atrophy, there is some concern this is a nonfunctioning kidney.  For further evaluation of this, in light of the obstruction, she underwent a nephrostomy tube placement followed by a Lasix renal scan.  Lasix renal scan revealed her left kidney was only functioning at 16% as comparison to the right, 84%.  In addition, the left kidney system was markedly dilated with severely impaired function with delayed radiotracer uptake and concentration despite presence of nephrostomy tube.  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  Previous abdominal surgeries include tubal ligation.  She is an excellent functional status and is overall quite healthy.  PMH: Past Medical History:  Diagnosis Date  . Dysrhythmia    PVCs  . Hypertension     Surgical History: Past Surgical History:  Procedure Laterality Date  .  COLONOSCOPY N/A 08/06/2014   Procedure: COLONOSCOPY;  Surgeon: Manya Silvas, MD;  Location: [facility] ENDOSCOPY;  Service: Endoscopy;  Laterality: N/A;  . IR NEPHROSTOMY PLACEMENT LEFT  09/24/2017  . TONSILLECTOMY    . TUBAL LIGATION      Home Medications:  Allergies as of 10/09/2017      Reactions   Raloxifene    Other reaction(s): Other (See Comments) hotflashes      Medication List        Accurate as of 10/09/17  3:55 PM. Always use your most recent med list.          cholecalciferol 1000 units tablet Commonly known as:  VITAMIN D Take 1,000 Units by mouth daily.   ibuprofen 200 MG tablet Commonly known as:  ADVIL,MOTRIN Take 400 mg by mouth every 6 (six) hours as needed for headache or moderate pain.   lisinopril 20 MG tablet Commonly known as:  PRINIVIL,ZESTRIL Take 20 mg by mouth daily.   lovastatin 20 MG tablet Commonly known as:  MEVACOR Take 20 mg by mouth at bedtime.   metoprolol succinate 100 MG 24 hr tablet Commonly known as:  TOPROL-XL Take 100 mg by mouth daily.   sodium chloride 0.9 % injection Inject 5 mLs into the vein every 8 (eight) hours.       Allergies:  Allergies  Allergen Reactions  . Raloxifene     Other reaction(s): Other (See Comments) hotflashes    Family History: No family history on file.  Social History:  reports that she quit smoking  about 42 years ago. She has never used smokeless tobacco. She reports that she drinks about 4.2 oz of alcohol per week. She reports that she does not use drugs.  ROS: UROLOGY Frequent Urination?: No Hard to postpone urination?: No Burning/pain with urination?: No Get up at night to urinate?: Yes Leakage of urine?: No Urine stream starts and stops?: No Trouble starting stream?: No Do you have to strain to urinate?: No Blood in urine?: No Urinary tract infection?: No Sexually transmitted disease?: No Injury to kidneys or bladder?: No Painful intercourse?: No Weak stream?: No Currently  pregnant?: No Vaginal bleeding?: No Last menstrual period?: n  Gastrointestinal Nausea?: No Vomiting?: No Indigestion/heartburn?: No Diarrhea?: No Constipation?: No  Constitutional Fever: No Night sweats?: No Weight loss?: No Fatigue?: Yes  Skin Skin rash/lesions?: No Itching?: No  Eyes Blurred vision?: No Double vision?: No  Ears/Nose/Throat Sore throat?: No Sinus problems?: No  Hematologic/Lymphatic Swollen glands?: No Easy bruising?: Yes  Cardiovascular Leg swelling?: No Chest pain?: No  Respiratory Cough?: No Shortness of breath?: No  Endocrine Excessive thirst?: No  Musculoskeletal Back pain?: No Joint pain?: No  Neurological Headaches?: No Dizziness?: No  Psychologic Depression?: No Anxiety?: No  Physical Exam: BP 135/77   Pulse 77   Ht 5\' 4"  (1.626 m)   Wt 150 lb (68 kg)   BMI 25.75 kg/m   Constitutional:  Alert and oriented, No acute distress.  He by a friend today. HEENT: [place] AT, moist mucus membranes.  Trachea midline, no masses. Cardiovascular: No clubbing, cyanosis, or edema. Respiratory: Normal respiratory effort, no increased work of breathing. GI: Abdomen is soft, nontender, nondistended, no abdominal masses.  Small approximately 2 cm vertical lower abdominal midline incision appreciated. GU: Nephrostomy tube in place draining clear yellow urine. Skin: No rashes, bruises or suspicious lesions. Neurologic: Grossly intact, no focal deficits, moving all 4 extremities. Psychiatric: Normal mood and affect.  Laboratory Data: None and as above  Urinalysis    Component Value Date/Time   APPEARANCEUR Slightly cloudy 09/20/2017 1321   GLUCOSEU Negative 09/20/2017 1321   BILIRUBINUR Negative 09/20/2017 1321   PROTEINUR Negative 09/20/2017 1321   NITRITE Negative 09/20/2017 1321   LEUKOCYTESUR Trace 09/20/2017 1321    Lab Results  Component Value Date   LABMICR See below: 09/20/2017   WBCUA >30 (A) 09/20/2017   RBCUA 11-30  (A) 09/20/2017   LABEPIT >10 (A) 09/20/2017   MUCUS Present (A) 09/20/2017   BACTERIA Moderate (A) 09/20/2017    Pertinent Imaging: CLINICAL DATA:  LEFT hydronephrosis, history kidney stone, nephrostomy tube  EXAM: NUCLEAR MEDICINE RENAL SCAN WITH DIURETIC ADMINISTRATION  TECHNIQUE: Radionuclide angiographic and sequential renal images were obtained after intravenous injection of radiopharmaceutical. Imaging was continued during slow intravenous injection of Lasix approximately 15 minutes after the start of the examination. Nephrostomy tube was clamped during imaging.  RADIOPHARMACEUTICALS:  5.5 mCi Technetium-39m MAG3 IV  Pharmaceutical: Lasix 34 mg IV 20 minutes into exam.  COMPARISON:  None  FINDINGS: Flow: Prompt normal blood flow to RIGHT kidney. Diminished and delayed flow to the LEFT kidney  Left renogram: Decreased uptake, concentration and excretion of tracer by RIGHT kidney. Thin renal cortex early in the exam. Slow excretion into a markedly dilated collecting system. By the conclusion of the exam, tracer is located within primarily within dilated renal calices, little in renal pelvis. No appreciable excretion of tracer during the study. Analysis of the renogram curve shows markedly delayed time to maximum activity, at the conclusion of the exam.  No appreciable excretion.  Right renogram: Normal uptake, concentration and excretion of tracer by RIGHT kidney. RIGHT kidney demonstrates a dilated collecting system. Good washout of tracer from the dilated collecting system begins prior to Lasix administration and accelerate following Lasix. Slight delayed time to peak activity of 8.5 minutes, likely due to dilated collecting system, with fall to half maximum activity at approximately 21 minutes. Significant retained tracer at conclusion of exam. No ureteral tracer retention is seen. 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  Differential:  Left kidney = 16 %  Right kidney = 84 %  T1/2 post Lasix :  Left kidney = N/A min  Right kidney = 26.2 min  IMPRESSION: Mildly dilated RIGHT renal collecting system.  Otherwise normal RIGHT renogram.  Markedly dilated LEFT renal collecting system with severely impaired renal function, with delayed uptake and concentration of tracer and markedly impaired excretion of tracer.  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   Electronically Signed   By: Lavonia Dana M.D.   On: 10/04/2017 16:29  Lasix renogram was personally reviewed today.  In addition, I have  also personally previous reviewed her noncontrast CT scan on a disc.  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  Lasix renogram with appropriate renal drainage in the form of nephrostomy tube shows severely impaired function at 18%.  Given this very low function, the utility of clearing her stone burden and possibly repairing some underlying UPJ if present would be somewhat limited.  We discussed alternatives today including observation, nephrectomy, versus treating with stones.  Risks and benefits were each discussed in detail.  Given her large stone burden and no instrumentation of this kidney, I am concerned for impending infection or complications from unobstructed system.  I would most strongly advocate simple nephrectomy which can be performed using a minimally invasive technique.  We discussed laparoscopic nephrectomy today in detail including the risks of surgery.  We discussed the preoperative, intraoperative, and postoperative course.  She understands the risk of bleeding, infection, damage to surrounding structures including injury to the bowel, hernia, ileus, and more serious complications including blood clot, heart attack, and death.  All of her questions were answered in detail.   She also understands the risk of possible conversion to open procedure although unlikely.  She would like to proceed with laparaoscopic hand-assisted nephrectomy.  1. Kidney stone on left side As above  2. Hydronephrosis, left Currently managed with nephrostomy tube We will plan for nephrostomy tube removal the time of nephrectomy  3. Left renal atrophy As above   Left hand-assisted lap nephrectomy  Hollice Espy, MD  [facility] [address], [place], [place] [address] [phone]  I spent 40 min with this patient of which greater than 50% was spent in counseling and coordination of care with the patient.

## 2017-10-09 NOTE — Progress Notes (Signed)
10/09/2017 3:55 PM   Tama Gander 10-Sep-1940 [id number]  Referring provider: Perrin Maltese, MD [place], [place] [address]  Chief Complaint  Patient presents with  . Results    NM study    HPI: 77 year old female who returns today to discuss management of her atrophic left kidney and obstructing stones.  She initially presented for further evaluation of nephrolithiasis and was seen by Zara Council.  She had a CT scan on 08/2017 at [facility] medical which noted multiple left renal calculi including 1.6 cm left renal pelvic calculus with pelviectasis.  Additionally, her serum creatinine is in from 0.61 and 2013 now up to 1.07 as of 09/11/2017.  She denies any urinary symptoms including no hematuria.  She does believe some occasional intermittent pain in her left lower quadrant but was unsure if this was related to her intestines kidney and never really had it evaluated.  She did have a personal history of kidney stones about 5 years ago but was told that the size of the stones were "sand".  On the appearance of her kidney on CT scan and severe atrophy, there is some concern this is a nonfunctioning kidney.  For further evaluation of this, in light of the obstruction, she underwent a nephrostomy tube placement followed by a Lasix renal scan.  Lasix renal scan revealed her left kidney was only functioning at 16% as comparison to the right, 84%.  In addition, the left kidney system was markedly dilated with severely impaired function with delayed radiotracer uptake and concentration despite presence of nephrostomy tube.  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  Previous abdominal surgeries include tubal ligation.  She is an excellent functional status and is overall quite healthy.  PMH: Past Medical History:  Diagnosis Date  . Dysrhythmia    PVCs  . Hypertension     Surgical History: Past Surgical History:  Procedure Laterality Date  .  COLONOSCOPY N/A 08/06/2014   Procedure: COLONOSCOPY;  Surgeon: Manya Silvas, MD;  Location: [facility] ENDOSCOPY;  Service: Endoscopy;  Laterality: N/A;  . IR NEPHROSTOMY PLACEMENT LEFT  09/24/2017  . TONSILLECTOMY    . TUBAL LIGATION      Home Medications:  Allergies as of 10/09/2017      Reactions   Raloxifene    Other reaction(s): Other (See Comments) hotflashes      Medication List        Accurate as of 10/09/17  3:55 PM. Always use your most recent med list.          cholecalciferol 1000 units tablet Commonly known as:  VITAMIN D Take 1,000 Units by mouth daily.   ibuprofen 200 MG tablet Commonly known as:  ADVIL,MOTRIN Take 400 mg by mouth every 6 (six) hours as needed for headache or moderate pain.   lisinopril 20 MG tablet Commonly known as:  PRINIVIL,ZESTRIL Take 20 mg by mouth daily.   lovastatin 20 MG tablet Commonly known as:  MEVACOR Take 20 mg by mouth at bedtime.   metoprolol succinate 100 MG 24 hr tablet Commonly known as:  TOPROL-XL Take 100 mg by mouth daily.   sodium chloride 0.9 % injection Inject 5 mLs into the vein every 8 (eight) hours.       Allergies:  Allergies  Allergen Reactions  . Raloxifene     Other reaction(s): Other (See Comments) hotflashes    Family History: No family history on file.  Social History:  reports that she quit smoking  about 42 years ago. She has never used smokeless tobacco. She reports that she drinks about 4.2 oz of alcohol per week. She reports that she does not use drugs.  ROS: UROLOGY Frequent Urination?: No Hard to postpone urination?: No Burning/pain with urination?: No Get up at night to urinate?: Yes Leakage of urine?: No Urine stream starts and stops?: No Trouble starting stream?: No Do you have to strain to urinate?: No Blood in urine?: No Urinary tract infection?: No Sexually transmitted disease?: No Injury to kidneys or bladder?: No Painful intercourse?: No Weak stream?: No Currently  pregnant?: No Vaginal bleeding?: No Last menstrual period?: n  Gastrointestinal Nausea?: No Vomiting?: No Indigestion/heartburn?: No Diarrhea?: No Constipation?: No  Constitutional Fever: No Night sweats?: No Weight loss?: No Fatigue?: Yes  Skin Skin rash/lesions?: No Itching?: No  Eyes Blurred vision?: No Double vision?: No  Ears/Nose/Throat Sore throat?: No Sinus problems?: No  Hematologic/Lymphatic Swollen glands?: No Easy bruising?: Yes  Cardiovascular Leg swelling?: No Chest pain?: No  Respiratory Cough?: No Shortness of breath?: No  Endocrine Excessive thirst?: No  Musculoskeletal Back pain?: No Joint pain?: No  Neurological Headaches?: No Dizziness?: No  Psychologic Depression?: No Anxiety?: No  Physical Exam: BP 135/77   Pulse 77   Ht 5\' 4"  (1.626 m)   Wt 150 lb (68 kg)   BMI 25.75 kg/m   Constitutional:  Alert and oriented, No acute distress.  He by a friend today. HEENT: [place] AT, moist mucus membranes.  Trachea midline, no masses. Cardiovascular: No clubbing, cyanosis, or edema. Respiratory: Normal respiratory effort, no increased work of breathing. GI: Abdomen is soft, nontender, nondistended, no abdominal masses.  Small approximately 2 cm vertical lower abdominal midline incision appreciated. GU: Nephrostomy tube in place draining clear yellow urine. Skin: No rashes, bruises or suspicious lesions. Neurologic: Grossly intact, no focal deficits, moving all 4 extremities. Psychiatric: Normal mood and affect.  Laboratory Data: None and as above  Urinalysis    Component Value Date/Time   APPEARANCEUR Slightly cloudy 09/20/2017 1321   GLUCOSEU Negative 09/20/2017 1321   BILIRUBINUR Negative 09/20/2017 1321   PROTEINUR Negative 09/20/2017 1321   NITRITE Negative 09/20/2017 1321   LEUKOCYTESUR Trace 09/20/2017 1321    Lab Results  Component Value Date   LABMICR See below: 09/20/2017   WBCUA >30 (A) 09/20/2017   RBCUA 11-30  (A) 09/20/2017   LABEPIT >10 (A) 09/20/2017   MUCUS Present (A) 09/20/2017   BACTERIA Moderate (A) 09/20/2017    Pertinent Imaging: CLINICAL DATA:  LEFT hydronephrosis, history kidney stone, nephrostomy tube  EXAM: NUCLEAR MEDICINE RENAL SCAN WITH DIURETIC ADMINISTRATION  TECHNIQUE: Radionuclide angiographic and sequential renal images were obtained after intravenous injection of radiopharmaceutical. Imaging was continued during slow intravenous injection of Lasix approximately 15 minutes after the start of the examination. Nephrostomy tube was clamped during imaging.  RADIOPHARMACEUTICALS:  5.5 mCi Technetium-35m MAG3 IV  Pharmaceutical: Lasix 34 mg IV 20 minutes into exam.  COMPARISON:  None  FINDINGS: Flow: Prompt normal blood flow to RIGHT kidney. Diminished and delayed flow to the LEFT kidney  Left renogram: Decreased uptake, concentration and excretion of tracer by RIGHT kidney. Thin renal cortex early in the exam. Slow excretion into a markedly dilated collecting system. By the conclusion of the exam, tracer is located within primarily within dilated renal calices, little in renal pelvis. No appreciable excretion of tracer during the study. Analysis of the renogram curve shows markedly delayed time to maximum activity, at the conclusion of the exam.  No appreciable excretion.  Right renogram: Normal uptake, concentration and excretion of tracer by RIGHT kidney. RIGHT kidney demonstrates a dilated collecting system. Good washout of tracer from the dilated collecting system begins prior to Lasix administration and accelerate following Lasix. Slight delayed time to peak activity of 8.5 minutes, likely due to dilated collecting system, with fall to half maximum activity at approximately 21 minutes. Significant retained tracer at conclusion of exam. No ureteral tracer retention is seen. 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  Differential:  Left kidney = 16 %  Right kidney = 84 %  T1/2 post Lasix :  Left kidney = N/A min  Right kidney = 26.2 min  IMPRESSION: Mildly dilated RIGHT renal collecting system.  Otherwise normal RIGHT renogram.  Markedly dilated LEFT renal collecting system with severely impaired renal function, with delayed uptake and concentration of tracer and markedly impaired excretion of tracer.  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   Electronically Signed   By: Lavonia Dana M.D.   On: 10/04/2017 16:29  Lasix renogram was personally reviewed today.  In addition, I have  also personally previous reviewed her noncontrast CT scan on a disc.  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  Lasix renogram with appropriate renal drainage in the form of nephrostomy tube shows severely impaired function at 18%.  Given this very low function, the utility of clearing her stone burden and possibly repairing some underlying UPJ if present would be somewhat limited.  We discussed alternatives today including observation, nephrectomy, versus treating with stones.  Risks and benefits were each discussed in detail.  Given her large stone burden and no instrumentation of this kidney, I am concerned for impending infection or complications from unobstructed system.  I would most strongly advocate simple nephrectomy which can be performed using a minimally invasive technique.  We discussed laparoscopic nephrectomy today in detail including the risks of surgery.  We discussed the preoperative, intraoperative, and postoperative course.  She understands the risk of bleeding, infection, damage to surrounding structures including injury to the bowel, hernia, ileus, and more serious complications including blood clot, heart attack, and death.  All of her questions were answered in detail.   She also understands the risk of possible conversion to open procedure although unlikely.  She would like to proceed with laparaoscopic hand-assisted nephrectomy.  1. Kidney stone on left side As above  2. Hydronephrosis, left Currently managed with nephrostomy tube We will plan for nephrostomy tube removal the time of nephrectomy  3. Left renal atrophy As above   Left hand-assisted lap nephrectomy  Hollice Espy, MD  [facility] [address] [place], [place] [address] [phone]  I spent 40 min with this patient of which greater than 50% was spent in counseling and coordination of care with the patient.

## 2017-10-12 ENCOUNTER — Other Ambulatory Visit: Payer: Self-pay | Admitting: Radiology

## 2017-10-12 DIAGNOSIS — N261 Atrophy of kidney (terminal): Secondary | ICD-10-CM

## 2017-10-16 ENCOUNTER — Other Ambulatory Visit: Payer: Self-pay | Admitting: Radiology

## 2017-10-22 ENCOUNTER — Encounter
Admission: RE | Admit: 2017-10-22 | Discharge: 2017-10-22 | Disposition: A | Discharge status: Home / Self Care | Payer: Medicare HMO | Source: Ambulatory Visit | Attending: Urology | Admitting: Urology

## 2017-10-22 ENCOUNTER — Other Ambulatory Visit: Payer: Self-pay

## 2017-10-22 DIAGNOSIS — N1330 Unspecified hydronephrosis: Secondary | ICD-10-CM | POA: Insufficient documentation

## 2017-10-22 DIAGNOSIS — Z01812 Encounter for preprocedural laboratory examination: Secondary | ICD-10-CM | POA: Insufficient documentation

## 2017-10-22 DIAGNOSIS — N200 Calculus of kidney: Secondary | ICD-10-CM | POA: Diagnosis not present

## 2017-10-22 DIAGNOSIS — I10 Essential (primary) hypertension: Secondary | ICD-10-CM | POA: Diagnosis not present

## 2017-10-22 DIAGNOSIS — Z79899 Other long term (current) drug therapy: Secondary | ICD-10-CM | POA: Insufficient documentation

## 2017-10-22 DIAGNOSIS — N261 Atrophy of kidney (terminal): Secondary | ICD-10-CM | POA: Insufficient documentation

## 2017-10-22 HISTORY — DX: Malignant (primary) neoplasm, unspecified: C80.1

## 2017-10-22 HISTORY — DX: Personal history of urinary calculi: Z87.442

## 2017-10-22 HISTORY — DX: Chronic kidney disease, unspecified: N18.9

## 2017-10-22 LAB — CBC
HEMATOCRIT: 41.2 % (ref 35.0–47.0)
HEMOGLOBIN: 13.8 g/dL (ref 12.0–16.0)
MCH: 31.2 pg (ref 26.0–34.0)
MCHC: 33.5 g/dL (ref 32.0–36.0)
MCV: 92.9 fL (ref 80.0–100.0)
Platelets: 170 10*3/uL (ref 150–440)
RBC: 4.43 MIL/uL (ref 3.80–5.20)
RDW: 13 % (ref 11.5–14.5)
WBC: 6 10*3/uL (ref 3.6–11.0)

## 2017-10-22 LAB — BASIC METABOLIC PANEL
ANION GAP: 8 (ref 5–15)
BUN: 23 mg/dL (ref 8–23)
CO2: 29 mmol/L (ref 22–32)
Calcium: 9.2 mg/dL (ref 8.9–10.3)
Chloride: 105 mmol/L (ref 98–111)
Creatinine, Ser: 1.01 mg/dL — ABNORMAL HIGH (ref 0.44–1.00)
GFR calc non Af Amer: 53 mL/min — ABNORMAL LOW (ref >60)
Glucose, Bld: 96 mg/dL (ref 70–99)
Potassium: 4.3 mmol/L (ref 3.5–5.1)
SODIUM: 142 mmol/L (ref 135–145)

## 2017-10-22 LAB — URINALYSIS, ROUTINE W REFLEX MICROSCOPIC
BACTERIA UA: NONE SEEN
Bilirubin Urine: NEGATIVE
GLUCOSE, UA: NEGATIVE mg/dL
KETONES UR: NEGATIVE mg/dL
NITRITE: NEGATIVE
PH: 5 (ref 5.0–8.0)
PROTEIN: NEGATIVE mg/dL
Specific Gravity, Urine: 1.015 (ref 1.005–1.030)

## 2017-10-22 LAB — PROTIME-INR
INR: 0.93
Prothrombin Time: 12.4 seconds (ref 11.4–15.2)

## 2017-10-22 NOTE — Patient Instructions (Addendum)
Your procedure is scheduled on: Monday, October 29, 2017  Report to [facility]   DO NOT STOP ON THE FIRST FLOOR TO REGISTER.  To find out your arrival time please call [phone] between 1PM - 3PM on Friday, October 26, 2017  Remember: Instructions that are not followed completely may result in serious medical risk,  up to and including death, or upon the discretion of your surgeon and anesthesiologist your  surgery may need to be rescheduled.     _X__ 1. Do not eat food after midnight the night before your procedure.                 No gum chewing or hard candies. NOTHING SOLID IN YOUR MOUTH AFTER MIDNIGHT.                  You may drink clear liquids up to 2 hours before you are scheduled to arrive for your surgery-                       DO not drink clear liquids within 2 hours of the start of your surgery.                  Clear Liquids include:  water, apple juice without pulp, clear carbohydrate                 drink such as Clearfast of Gatorade, Black Coffee or Tea (Do not add                 anything to coffee or tea). NO DAIRY PRODUCTS / HONEY / LEMON / BROTH/ SOUPS  __X__2.  On the morning of surgery brush your teeth with toothpaste and water,                   You may rinse your mouth with mouthwash if you wish.                      Do not swallow any toothpaste of mouthwash.     _X__ 3.  No Alcohol for 24 hours before or after surgery.   _X__ 4.  Do Not Smoke or use e-cigarettes For 24 Hours Prior to Your Surgery.                 Do not use any chewable tobacco products for at least 6 hours prior to                 surgery.  ____  5.  Bring all medications with you on the day of surgery if instructed.   _X___  6.  Notify your doctor if there is any change in your medical condition      (cold, fever, infections).     Do not wear jewelry, make-up, hairpins, clips or nail polish. Do not wear lotions, powders, or  perfumes. You may wear deodorant. Do not shave 48 hours prior to surgery. Men may shave face and neck. Do not bring valuables to the hospital.    [facility] is not responsible for any belongings or valuables.  Contacts, dentures or bridgework may not be worn into surgery. Leave your suitcase in the car. After surgery it may be brought to your room. For patients admitted to the hospital, discharge time is determined by your treatment team.   Patients discharged the day of surgery will not be allowed to  drive home.   Please read over the following fact sheets that you were given:   PREPARING FOR SURGERY   ____ Take these medicines the morning of surgery with A SIP OF WATER:    1. NONE  2.   3.   4.  5.  6.  ____ Fleet Enema (as directed)   __X__ Use CHG WIPES as directed  __X__ Stop ALL ASPIRIN PRODUCTS AS OF TODAY             THIS INCLUDES EXCEDRIN / BC POWDER / GOODIES POWDERS   _X___ Stop Anti-inflammatories AS OF TODAY             THIS INCLUDES IBUPROFEN / MOTRIN / ADVIL / ALEVE                       YOU MAY TAKE TYLENOL AT [place]                                            (MAXIMUM DOSAGE 4000 MG IN A DAY)  ____ Stop supplements until after surgery.    ____ Bring C-Pap to the hospital.   [place].  HAVE STURDY SHOES   BRING MEDICAL DIRECTIVES SO WE CAN PLACE A COPY IN YOUR CHART  CONTINUE TO TAKE ALL YOUR MEDICATIONS AT NIGHT AS USUAL.

## 2017-10-23 NOTE — Pre-Procedure Instructions (Signed)
UA results sent to Dr. Brandon for review.  

## 2017-10-24 LAB — URINE CULTURE: Culture: >=100000 — AB

## 2017-10-25 ENCOUNTER — Telehealth: Payer: Self-pay | Admitting: Radiology

## 2017-10-25 DIAGNOSIS — N390 Urinary tract infection, site not specified: Secondary | ICD-10-CM

## 2017-10-25 DIAGNOSIS — R319 Hematuria, unspecified: Principal | ICD-10-CM

## 2017-10-25 MED ORDER — SULFAMETHOXAZOLE-TRIMETHOPRIM 800-160 MG PO TABS
1.0000 | ORAL_TABLET | Freq: Two times a day (BID) | ORAL | 0 refills | Status: DC
Start: 2017-10-25 — End: 2017-12-04

## 2017-10-25 NOTE — Telephone Encounter (Signed)
-----   Message from Hollice Espy, MD sent at 10/25/2017  8:30 AM EDT ----- This patient has staph in her urine but it is likely contaminant.  As a precaution, lets treat with Bactrim DS bid p.o. daily for 7 days.    Hollice Espy, MD

## 2017-10-25 NOTE — Telephone Encounter (Signed)
Patient expresses concern about taking Bactrim DS because the pharmacist advised that this medication can react with the lisinopril that patient is currently taking, causing an increase in potassium level. Per Dr Erlene Quan, advised patient that risk is small given that this is a short term medication. Questions answered. Patient voices understanding.

## 2017-10-25 NOTE — Telephone Encounter (Signed)
Notified patient of positive urine culture & likely contaminate as well as script sent to pharmacy. Instructions given. Patient voices understanding.

## 2017-11-02 LAB — PREPARE RBC (CROSSMATCH)

## 2017-11-04 MED ORDER — CEFAZOLIN SODIUM-DEXTROSE 2-4 GM/100ML-% IV SOLN
2.0000 g | INTRAVENOUS | Status: AC
  Administered 2017-11-05: 2 g via INTRAVENOUS

## 2017-11-05 ENCOUNTER — Inpatient Hospital Stay: Payer: Medicare HMO | Admitting: Anesthesiology

## 2017-11-05 ENCOUNTER — Inpatient Hospital Stay
Admission: RE | Admit: 2017-11-05 | Discharge: 2017-11-06 | DRG: 661 | Disposition: A | Discharge status: Home / Self Care | Payer: Medicare HMO | Attending: Urology | Admitting: Urology

## 2017-11-05 ENCOUNTER — Encounter
Admission: RE | Disposition: A | Discharge status: Home / Self Care | Payer: Self-pay | Source: Home / Self Care | Attending: Urology

## 2017-11-05 ENCOUNTER — Other Ambulatory Visit: Payer: Self-pay

## 2017-11-05 ENCOUNTER — Encounter: Payer: Self-pay | Admitting: *Deleted

## 2017-11-05 DIAGNOSIS — N2889 Other specified disorders of kidney and ureter: Secondary | ICD-10-CM | POA: Diagnosis present

## 2017-11-05 DIAGNOSIS — Z888 Allergy status to other drugs, medicaments and biological substances status: Secondary | ICD-10-CM

## 2017-11-05 DIAGNOSIS — I10 Essential (primary) hypertension: Secondary | ICD-10-CM | POA: Diagnosis present

## 2017-11-05 DIAGNOSIS — Z79899 Other long term (current) drug therapy: Secondary | ICD-10-CM | POA: Diagnosis not present

## 2017-11-05 DIAGNOSIS — N200 Calculus of kidney: Secondary | ICD-10-CM

## 2017-11-05 DIAGNOSIS — N132 Hydronephrosis with renal and ureteral calculous obstruction: Secondary | ICD-10-CM | POA: Diagnosis present

## 2017-11-05 DIAGNOSIS — N261 Atrophy of kidney (terminal): Secondary | ICD-10-CM | POA: Diagnosis present

## 2017-11-05 DIAGNOSIS — D4102 Neoplasm of uncertain behavior of left kidney: Secondary | ICD-10-CM | POA: Diagnosis not present

## 2017-11-05 DIAGNOSIS — Z87891 Personal history of nicotine dependence: Secondary | ICD-10-CM | POA: Diagnosis not present

## 2017-11-05 DIAGNOSIS — N183 Chronic kidney disease, stage 3 (moderate): Secondary | ICD-10-CM | POA: Diagnosis present

## 2017-11-05 HISTORY — PX: NEPHROSTOMY TUBE REMOVAL: SHX6794

## 2017-11-05 HISTORY — PX: LAPAROSCOPIC NEPHRECTOMY, HAND ASSISTED: SHX1929

## 2017-11-05 LAB — ABO/RH: ABO/RH(D): O POS

## 2017-11-05 SURGERY — NEPHRECTOMY, HAND-ASSISTED, LAPAROSCOPIC
Anesthesia: General | Site: Flank | Laterality: Left | Wound class: "Clean Contaminated "

## 2017-11-05 MED ORDER — ACETAMINOPHEN 10 MG/ML IV SOLN
INTRAVENOUS | Status: AC
  Filled 2017-11-05: qty 100

## 2017-11-05 MED ORDER — ONDANSETRON HCL 4 MG/2ML IJ SOLN: 4.0000 mg | Freq: Once | INTRAMUSCULAR | Status: DC | PRN

## 2017-11-05 MED ORDER — ONDANSETRON HCL 4 MG/2ML IJ SOLN
INTRAMUSCULAR | Status: DC | PRN
  Administered 2017-11-05 (×2): 4 mg via INTRAVENOUS

## 2017-11-05 MED ORDER — SODIUM CHLORIDE 0.9 % IV SOLN
INTRAVENOUS | Status: DC
  Administered 2017-11-05 – 2017-11-06 (×3): via INTRAVENOUS

## 2017-11-05 MED ORDER — PROPOFOL 10 MG/ML IV BOLUS
INTRAVENOUS | Status: AC
  Filled 2017-11-05: qty 20

## 2017-11-05 MED ORDER — LIDOCAINE HCL (CARDIAC) PF 100 MG/5ML IV SOSY
PREFILLED_SYRINGE | INTRAVENOUS | Status: DC | PRN
  Administered 2017-11-05: 100 mg via INTRAVENOUS

## 2017-11-05 MED ORDER — CEFAZOLIN SODIUM-DEXTROSE 1-4 GM/50ML-% IV SOLN
1.0000 g | Freq: Three times a day (TID) | INTRAVENOUS | Status: AC
  Administered 2017-11-05 – 2017-11-06 (×2): 1 g via INTRAVENOUS
  Filled 2017-11-05 (×2): qty 50

## 2017-11-05 MED ORDER — CEFAZOLIN SODIUM-DEXTROSE 2-4 GM/100ML-% IV SOLN
INTRAVENOUS | Status: AC
  Filled 2017-11-05: qty 100

## 2017-11-05 MED ORDER — FENTANYL CITRATE (PF) 250 MCG/5ML IJ SOLN
INTRAMUSCULAR | Status: AC
  Filled 2017-11-05: qty 5

## 2017-11-05 MED ORDER — BUPIVACAINE LIPOSOME 1.3 % IJ SUSP
INTRAMUSCULAR | Status: AC
  Filled 2017-11-05: qty 20

## 2017-11-05 MED ORDER — DEXAMETHASONE SODIUM PHOSPHATE 10 MG/ML IJ SOLN
INTRAMUSCULAR | Status: DC | PRN
  Administered 2017-11-05: 10 mg via INTRAVENOUS

## 2017-11-05 MED ORDER — FAMOTIDINE 20 MG PO TABS
20.0000 mg | ORAL_TABLET | Freq: Once | ORAL | Status: AC
  Administered 2017-11-05: 20 mg via ORAL

## 2017-11-05 MED ORDER — PROPOFOL 10 MG/ML IV BOLUS
INTRAVENOUS | Status: DC | PRN
  Administered 2017-11-05: 100 mg via INTRAVENOUS

## 2017-11-05 MED ORDER — LACTATED RINGERS IV SOLN
INTRAVENOUS | Status: DC
  Administered 2017-11-05 (×2): via INTRAVENOUS

## 2017-11-05 MED ORDER — METOPROLOL SUCCINATE ER 100 MG PO TB24
100.0000 mg | ORAL_TABLET | Freq: Every day | ORAL | Status: DC
  Administered 2017-11-05: 100 mg via ORAL
  Filled 2017-11-05: qty 1

## 2017-11-05 MED ORDER — MIDAZOLAM HCL 2 MG/2ML IJ SOLN
INTRAMUSCULAR | Status: DC | PRN
  Administered 2017-11-05 (×2): 1 mg via INTRAVENOUS

## 2017-11-05 MED ORDER — BELLADONNA ALKALOIDS-OPIUM 16.2-60 MG RE SUPP: 1.0000 | Freq: Four times a day (QID) | RECTAL | Status: DC | PRN

## 2017-11-05 MED ORDER — FENTANYL CITRATE (PF) 100 MCG/2ML IJ SOLN
INTRAMUSCULAR | Status: DC | PRN
  Administered 2017-11-05 (×5): 50 ug via INTRAVENOUS

## 2017-11-05 MED ORDER — ONDANSETRON HCL 4 MG/2ML IJ SOLN: 4.0000 mg | INTRAMUSCULAR | Status: DC | PRN

## 2017-11-05 MED ORDER — SEVOFLURANE IN SOLN
RESPIRATORY_TRACT | Status: AC
  Filled 2017-11-05: qty 250

## 2017-11-05 MED ORDER — HEPARIN SODIUM (PORCINE) 5000 UNIT/ML IJ SOLN
5000.0000 [IU] | Freq: Three times a day (TID) | INTRAMUSCULAR | Status: DC
  Administered 2017-11-05 – 2017-11-06 (×4): 5000 [IU] via SUBCUTANEOUS
  Filled 2017-11-05 (×4): qty 1

## 2017-11-05 MED ORDER — THROMBIN 5000 UNITS EX SOLR
CUTANEOUS | Status: AC
  Filled 2017-11-05: qty 5000

## 2017-11-05 MED ORDER — PRAVASTATIN SODIUM 20 MG PO TABS
20.0000 mg | ORAL_TABLET | Freq: Every day | ORAL | Status: DC
  Administered 2017-11-05: 20 mg via ORAL
  Filled 2017-11-05: qty 1

## 2017-11-05 MED ORDER — DIPHENHYDRAMINE HCL 12.5 MG/5ML PO ELIX
12.5000 mg | ORAL_SOLUTION | Freq: Four times a day (QID) | ORAL | Status: DC | PRN
  Filled 2017-11-05: qty 5

## 2017-11-05 MED ORDER — ACETAMINOPHEN 10 MG/ML IV SOLN
INTRAVENOUS | Status: DC | PRN
  Administered 2017-11-05: 1000 mg via INTRAVENOUS

## 2017-11-05 MED ORDER — BUPIVACAINE HCL (PF) 0.5 % IJ SOLN
INTRAMUSCULAR | Status: AC
  Filled 2017-11-05: qty 30

## 2017-11-05 MED ORDER — MIDAZOLAM HCL 2 MG/2ML IJ SOLN
INTRAMUSCULAR | Status: AC
  Filled 2017-11-05: qty 2

## 2017-11-05 MED ORDER — LIDOCAINE HCL (PF) 2 % IJ SOLN
INTRAMUSCULAR | Status: AC
  Filled 2017-11-05: qty 10

## 2017-11-05 MED ORDER — ROCURONIUM BROMIDE 100 MG/10ML IV SOLN
INTRAVENOUS | Status: DC | PRN
  Administered 2017-11-05: 40 mg via INTRAVENOUS
  Administered 2017-11-05: 10 mg via INTRAVENOUS
  Administered 2017-11-05: 30 mg via INTRAVENOUS

## 2017-11-05 MED ORDER — FENTANYL CITRATE (PF) 100 MCG/2ML IJ SOLN
INTRAMUSCULAR | Status: AC
  Administered 2017-11-05: 25 ug via INTRAVENOUS
  Filled 2017-11-05: qty 2

## 2017-11-05 MED ORDER — FENTANYL CITRATE (PF) 100 MCG/2ML IJ SOLN
25.0000 ug | INTRAMUSCULAR | Status: DC | PRN
  Administered 2017-11-05 (×4): 25 ug via INTRAVENOUS

## 2017-11-05 MED ORDER — FAMOTIDINE 20 MG PO TABS
ORAL_TABLET | ORAL | Status: AC
  Administered 2017-11-05: 20 mg via ORAL
  Filled 2017-11-05: qty 1

## 2017-11-05 MED ORDER — DIPHENHYDRAMINE HCL 50 MG/ML IJ SOLN: 12.5000 mg | Freq: Four times a day (QID) | INTRAMUSCULAR | Status: DC | PRN

## 2017-11-05 MED ORDER — OXYBUTYNIN CHLORIDE 5 MG PO TABS: 5.0000 mg | ORAL_TABLET | Freq: Three times a day (TID) | ORAL | Status: DC | PRN

## 2017-11-05 MED ORDER — LISINOPRIL 20 MG PO TABS
20.0000 mg | ORAL_TABLET | Freq: Every day | ORAL | Status: DC
  Administered 2017-11-05: 20 mg via ORAL
  Filled 2017-11-05: qty 1

## 2017-11-05 MED ORDER — MORPHINE SULFATE (PF) 2 MG/ML IV SOLN: 2.0000 mg | INTRAVENOUS | Status: DC | PRN

## 2017-11-05 MED ORDER — THROMBIN 5000 UNITS EX SOLR
CUTANEOUS | Status: DC | PRN
  Administered 2017-11-05: 5000 [IU] via TOPICAL

## 2017-11-05 MED ORDER — SODIUM CHLORIDE 0.9 % IV SOLN
INTRAVENOUS | Status: DC | PRN
  Administered 2017-11-05: 25 ug/min via INTRAVENOUS

## 2017-11-05 MED ORDER — PHENYLEPHRINE HCL 10 MG/ML IJ SOLN
INTRAMUSCULAR | Status: DC | PRN
  Administered 2017-11-05: 100 ug via INTRAVENOUS

## 2017-11-05 MED ORDER — PHENYLEPHRINE HCL 10 MG/ML IJ SOLN
INTRAMUSCULAR | Status: AC
  Filled 2017-11-05: qty 1

## 2017-11-05 MED ORDER — DEXAMETHASONE SODIUM PHOSPHATE 10 MG/ML IJ SOLN
INTRAMUSCULAR | Status: AC
  Filled 2017-11-05: qty 1

## 2017-11-05 MED ORDER — SUCCINYLCHOLINE CHLORIDE 20 MG/ML IJ SOLN
INTRAMUSCULAR | Status: AC
  Filled 2017-11-05: qty 1

## 2017-11-05 MED ORDER — OXYCODONE-ACETAMINOPHEN 5-325 MG PO TABS
1.0000 | ORAL_TABLET | ORAL | Status: DC | PRN
  Administered 2017-11-05 – 2017-11-06 (×3): 1 via ORAL
  Filled 2017-11-05 (×3): qty 1

## 2017-11-05 MED ORDER — ROCURONIUM BROMIDE 50 MG/5ML IV SOLN
INTRAVENOUS | Status: AC
  Filled 2017-11-05: qty 1

## 2017-11-05 MED ORDER — SUGAMMADEX SODIUM 500 MG/5ML IV SOLN
INTRAVENOUS | Status: DC | PRN
  Administered 2017-11-05: 135.6 mg via INTRAVENOUS

## 2017-11-05 MED ORDER — ACETAMINOPHEN 325 MG PO TABS
650.0000 mg | ORAL_TABLET | ORAL | Status: DC | PRN
  Administered 2017-11-06 (×2): 650 mg via ORAL
  Filled 2017-11-05 (×2): qty 2

## 2017-11-05 MED ORDER — SUCCINYLCHOLINE CHLORIDE 20 MG/ML IJ SOLN
INTRAMUSCULAR | Status: DC | PRN
  Administered 2017-11-05: 100 mg via INTRAVENOUS

## 2017-11-05 MED ORDER — DOCUSATE SODIUM 100 MG PO CAPS
100.0000 mg | ORAL_CAPSULE | Freq: Two times a day (BID) | ORAL | Status: DC
  Administered 2017-11-05 – 2017-11-06 (×3): 100 mg via ORAL
  Filled 2017-11-05 (×3): qty 1

## 2017-11-05 MED ORDER — ONDANSETRON HCL 4 MG/2ML IJ SOLN
INTRAMUSCULAR | Status: AC
  Filled 2017-11-05: qty 2

## 2017-11-05 SURGICAL SUPPLY — 82 items
"PENCIL ELECTRO HAND CTR " (MISCELLANEOUS) ×2 IMPLANT
ANCHOR TIS RET SYS 1550ML (BAG) ×2 IMPLANT
APPLICATOR SURGIFLO ENDO (HEMOSTASIS) ×2 IMPLANT
APPLIER CLIP ROT 10 11.4 M/L (STAPLE)
APPLIER CLIP ROT 13.4 12 LRG (CLIP) ×4
CANISTER SUCT 1200ML W/VALVE (MISCELLANEOUS) ×4 IMPLANT
CHLORAPREP W/TINT 26ML (MISCELLANEOUS) ×4 IMPLANT
CLEANER CAUTERY TIP 5X5 PAD (MISCELLANEOUS) ×2 IMPLANT
CLIP APPLIE ROT 10 11.4 M/L (STAPLE) IMPLANT
CLIP APPLIE ROT 13.4 12 LRG (CLIP) ×2 IMPLANT
CLIP VESOLOCK LG 6/CT PURPLE (CLIP) ×4 IMPLANT
CLOSURE WOUND 1/2 X4 (GAUZE/BANDAGES/DRESSINGS)
CUTTER ECHEON FLEX ENDO 45 340 (ENDOMECHANICALS) ×2 IMPLANT
DEFOGGER SCOPE WARMER CLEARIFY (MISCELLANEOUS) ×4 IMPLANT
DERMABOND ADVANCED (GAUZE/BANDAGES/DRESSINGS) ×2
DERMABOND ADVANCED .7 DNX12 (GAUZE/BANDAGES/DRESSINGS) ×2 IMPLANT
DISSECTOR KITTNER STICK (MISCELLANEOUS) ×2 IMPLANT
DISSECTORS/KITTNER STICK (MISCELLANEOUS) ×4
DRAPE INCISE IOBAN 66X45 STRL (DRAPES) ×4 IMPLANT
DRAPE STERI POUCH LG 24X46 STR (DRAPES) ×4 IMPLANT
DRAPE SURG 17X11 SM STRL (DRAPES) ×16 IMPLANT
DRSG TEGADERM 2-3/8X2-3/4 SM (GAUZE/BANDAGES/DRESSINGS) IMPLANT
DRSG TEGADERM 4X4.75 (GAUZE/BANDAGES/DRESSINGS) IMPLANT
DRSG TELFA 3X8 NADH (GAUZE/BANDAGES/DRESSINGS) IMPLANT
ELECT REM PT RETURN 9FT ADLT (ELECTROSURGICAL) ×4
ELECTRODE REM PT RTRN 9FT ADLT (ELECTROSURGICAL) ×2 IMPLANT
GLOVE BIO SURGEON STRL SZ 6.5 (GLOVE) ×6 IMPLANT
GLOVE BIO SURGEONS STRL SZ 6.5 (GLOVE) ×2
GLOVE INDICATOR 6.5 STRL GRN (GLOVE) ×6 IMPLANT
GOWN STRL REUS W/ TWL LRG LVL3 (GOWN DISPOSABLE) ×6 IMPLANT
GOWN STRL REUS W/TWL LRG LVL3 (GOWN DISPOSABLE) ×6
GRASPER SUT TROCAR 14GX15 (MISCELLANEOUS) ×4 IMPLANT
HANDLE YANKAUER SUCT BULB TIP (MISCELLANEOUS) ×2 IMPLANT
HOLDER FOLEY CATH W/STRAP (MISCELLANEOUS) ×4 IMPLANT
IRRIGATION STRYKERFLOW (MISCELLANEOUS) ×2 IMPLANT
IRRIGATOR STRYKERFLOW (MISCELLANEOUS) ×4
IV NS 1000ML (IV SOLUTION) ×2
IV NS 1000ML BAXH (IV SOLUTION) ×2 IMPLANT
KIT PINK PAD W/HEAD ARE REST (MISCELLANEOUS) ×4
KIT PINK PAD W/HEAD ARM REST (MISCELLANEOUS) ×2 IMPLANT
KIT TURNOVER KIT A (KITS) ×4 IMPLANT
L-HOOK LAP DISP 36CM (ELECTROSURGICAL) ×4
LABEL OR SOLS (LABEL) ×4 IMPLANT
LHOOK LAP DISP 36CM (ELECTROSURGICAL) ×2 IMPLANT
LIGASURE LAP ATLAS 10MM 37CM (INSTRUMENTS) ×4 IMPLANT
LOOP RED MAXI  1X406MM (MISCELLANEOUS) ×2
LOOP VESSEL MAXI 1X406 RED (MISCELLANEOUS) IMPLANT
NDL HYPO 21X1.5 SAFETY (NEEDLE) ×2 IMPLANT
NDL INSUFFLATION 14GA 120MM (NEEDLE) ×2 IMPLANT
NEEDLE HYPO 21X1.5 SAFETY (NEEDLE) ×4 IMPLANT
NEEDLE INSUFFLATION 14GA 120MM (NEEDLE) ×4 IMPLANT
PACK LAP CHOLECYSTECTOMY (MISCELLANEOUS) ×4 IMPLANT
PAD CLEANER CAUTERY TIP 5X5 (MISCELLANEOUS) ×2
PAD DRESSING TELFA 3X8 NADH (GAUZE/BANDAGES/DRESSINGS) IMPLANT
PENCIL ELECTRO HAND CTR (MISCELLANEOUS) ×4 IMPLANT
RELOAD STAPLE 35X2.5 WHT THIN (STAPLE) IMPLANT
RELOAD WH ECHELON 45 (STAPLE) ×8 IMPLANT
SCISSORS METZENBAUM CVD 33 (INSTRUMENTS) ×4 IMPLANT
SPOGE SURGIFLO 8M (HEMOSTASIS) ×2
SPONGE LAP 18X18 RF (DISPOSABLE) ×4 IMPLANT
SPONGE SURGIFLO 8M (HEMOSTASIS) IMPLANT
STAPLE RELOAD 2.5MM WHITE (STAPLE) IMPLANT
STAPLER SKIN PROX 35W (STAPLE) ×4 IMPLANT
STAPLER VASCULAR ECHELON 35 (CUTTER) IMPLANT
STRIP CLOSURE SKIN 1/2X4 (GAUZE/BANDAGES/DRESSINGS) IMPLANT
SUT CHROMIC 0 CT 1 (SUTURE) IMPLANT
SUT MNCRL AB 4-0 PS2 18 (SUTURE) ×8 IMPLANT
SUT PDS AB 1 CT1 36 (SUTURE) ×12 IMPLANT
SUT VIC AB 0 CT1 36 (SUTURE) ×8 IMPLANT
SUT VIC AB 1 CT1 36 (SUTURE) ×8 IMPLANT
SUT VIC AB 2-0 SH 27 (SUTURE)
SUT VIC AB 2-0 SH 27XBRD (SUTURE) IMPLANT
SUT VIC AB 2-0 UR6 27 (SUTURE) IMPLANT
SUT VIC AB 4-0 FS2 27 (SUTURE) IMPLANT
SUT VICRYL 0 AB UR-6 (SUTURE) ×6 IMPLANT
SYS LAPSCP GELPORT 120MM (MISCELLANEOUS) ×4
SYSTEM LAPSCP GELPORT 120MM (MISCELLANEOUS) ×2 IMPLANT
TRAY FOLEY MTR SLVR 16FR STAT (SET/KITS/TRAYS/PACK) ×4 IMPLANT
TROCAR ENDOPATH XCEL 12X100 BL (ENDOMECHANICALS) ×4 IMPLANT
TROCAR XCEL 12X100 BLDLESS (ENDOMECHANICALS) ×8 IMPLANT
TROCAR XCEL NON-BLD 5MMX100MML (ENDOMECHANICALS) ×4 IMPLANT
TUBING INSUFFLATION (TUBING) ×4 IMPLANT

## 2017-11-05 NOTE — Anesthesia Preprocedure Evaluation (Signed)
Anesthesia Evaluation  Patient identified by MRN, date of birth, ID band  Reviewed: Allergy & Precautions, H&P , NPO status , Patient's Chart, lab work & pertinent test results, reviewed documented beta blocker date and time   History of Anesthesia Complications Negative for: history of anesthetic complications  Airway Mallampati: III  TM Distance: >3 FB Neck ROM: full    Dental  (+) Caps, Dental Advidsory Given, Implants, Teeth Intact   Pulmonary neg pulmonary ROS, former smoker,           Cardiovascular Exercise Tolerance: Good hypertension, (-) angina(-) CAD, (-) Past MI, (-) Cardiac Stents and (-) CABG + dysrhythmias (-) Valvular Problems/Murmurs     Neuro/Psych negative neurological ROS  negative psych ROS   GI/Hepatic negative GI ROS, Neg liver ROS,   Endo/Other  negative endocrine ROS  Renal/GU Renal disease (kidney stones)  negative genitourinary   Musculoskeletal   Abdominal   Peds  Hematology negative hematology ROS (+)   Anesthesia Other Findings Past Medical History: 06/2017: Cancer ([place])     Comment:  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 09/2017: Chronic kidney disease     Comment:  left renal atrophy No date: Dysrhythmia     Comment:  PVCs No date: History of kidney stones No date: Hypertension   Reproductive/Obstetrics negative OB ROS                             Anesthesia Physical  Anesthesia Plan  ASA: II  Anesthesia Plan: General   Post-op Pain Management:    Induction: Intravenous  PONV Risk Score and Plan: 3 and Ondansetron and Dexamethasone  Airway Management Planned: Oral ETT  Additional Equipment:   Intra-op Plan:   Post-operative Plan: Extubation in OR  Informed Consent: I have reviewed the patients History and Physical, chart, labs and discussed the procedure including the risks, benefits and alternatives for the proposed anesthesia with the patient or  authorized representative who has indicated his/her understanding and acceptance.   Dental Advisory Given  Plan Discussed with: CRNA and Surgeon  Anesthesia Plan Comments:         Anesthesia Quick Evaluation

## 2017-11-05 NOTE — Interval H&P Note (Signed)
History and Physical Interval Note:  11/05/2017 8:52 AM  [name]  has presented today for surgery, with the diagnosis of Left renal atrophy, nephrolithiasis  The various methods of treatment have been discussed with the patient and family. After consideration of risks, benefits and other options for treatment, the patient has consented to  Procedure(s): HAND ASSISTED LAPAROSCOPIC NEPHRECTOMY (Left) as a surgical intervention .  The patient's history has been reviewed, patient examined, no change in status, stable for surgery.  I have reviewed the patient's chart and labs.  Questions were answered to the patient's satisfaction.    RRR CTAB  Hollice Espy

## 2017-11-05 NOTE — Transfer of Care (Signed)
Immediate Anesthesia Transfer of Care Note  Patient: [name]  Procedure(s) Performed: HAND ASSISTED LAPAROSCOPIC NEPHRECTOMY (Left Abdomen) NEPHROSTOMY TUBE REMOVAL (Left Flank)  Patient Location: PACU  Anesthesia Type:General  Level of Consciousness: awake and sedated  Airway & Oxygen Therapy: Patient Spontanous Breathing and Patient connected to face mask oxygen  Post-op Assessment: Report given to RN and Post -op Vital signs reviewed and stable  Post vital signs: Reviewed and stable  Last Vitals:  Vitals Value Taken Time  BP    Temp    Pulse 66 11/05/2017  1:01 PM  Resp 20 11/05/2017  1:01 PM  SpO2 97 % 11/05/2017  1:01 PM  Vitals shown include unvalidated device data.  Last Pain:  Vitals:   11/05/17 0731  TempSrc: Temporal  PainSc: 0-No pain         Complications: No apparent anesthesia complications

## 2017-11-05 NOTE — Op Note (Addendum)
PREOP DIAGNOSIS: Left renal mass  POSTOPERATIVE DIAGNOSIS: Left renal mass  OPERATION PERFORMED: Hand-assisted laparoscopic left radical nephrectomy.  SURGEON: Hollice Espy, MD  Assistant:  Melissa Noon PA  ANESTHESIA: General.  ESTIMATED BLOOD LOSS: 150  cc  DRAINS: 16-French Foley catheter.   COMPLICATIONS: None.  Indications: Atrophic left kidney with obstructing calculi  FINDINGS: Hilar anatomy: Arteries: 1 Veins: 1 Ureters: 1   DESCRIPTION OF OPERATION: Informed consent was obtained. The patient was marked on the left side. IV antibiotics were given for bacterial prophylaxis on call to the operating room. SCDs were provided for DVT prophylaxis. The patient was taken to the operating room and placed supine on the operating table. General anesthesia was provided. A Foley catheter was placed to drain the bladder.  The patient was positioned in right lateral decubitus with the left flank elevated about 70 degrees and the table flexed slightly. The left arm was placed in a padded airplane for support. Axillary roll was positioned. The patient was secured to the table with soft straps and then prepped and draped sterilely.   We had a time-out confirming the patient identification, planned procedure, surgical site, and all present were in agreement. All present were in agreement.   A 8 cm incision was made for the hand port in the left lower quadrant. The anterior fascia was incised and elevated. The rectus belly was retracted medially and the peritoneum was incised. An incisional block was provided with liposomal Marcaine. The GelPort was assembled. A 12 mm trocar was placed above the umbilicus, and then the abdomen was insufflated. Laparoscopic survey revealed no abnormalities or injuries. An additional 12 mm trocar was just below  the subxiphoid. All port sites were then infiltrated with liposomal Marcaine. Zero Vicryls were placed at the 12 mm  trocar sites with the Carter-Thomason device for closure at the end of the case.   The white line of Toldt was incised. The colon was reflected from the spleen to the pelvis. Gerota's fascia was lifted up off the lower pole of the kidney and the ureter and gonadal vessels were identified. The gonadal vein was exposed and followed up to the main renal vein. The branch point of the gonadal vein and adrenal vein were exposed at the renal vein.  I ultimately took the gonadal vein using a stapler load to help for better hilar exposure.  The upper pole of the kidney was mobilized off of the quadratus lumborum and further mobilized away from the spleen. The adrenal gland was identified and spared during the upper pole dissection. The lower pole and lateral attachments were freed. The kidney was held laterally and the hilar dissection was completed.  A single renal artery was noted.    The ureter was exposed, clipped distally using 12 mm clips, and divided sharply. The ureter stump was confirmed hemostatic.  At this point, the renal artery was divided with a 45 mm vascular load staple using the battery operated endovascular stapler.  The artery and vein were relatively skeletonized and were taken en bloc with a single staple fire.  The kidney was extracted through the gel port and passed off for pathological analysis.  It was incised on the back table to expose the stones.   Pneumoperitoneal pressure was reduced to 7 mmHg and the abdomen was inspected; hemostasis was confirmed. The 12 mm trocars were removed and the port sites closed with previously placed 0 Vicryl. The Gelport was removed. The anterior fascia was closed with #1 PDS in  interrupted figure-of-eight fashion..  The subcutaneous tissue was closed using 2-0 vicryl. All the incisions were irrigated, patted dry, and then the skin was reapproximated with 4-0 Monocryl in a subcuticular fashion. The wounds were cleaned and dried  and covered with Dermabond. All sponge, needle, and instrument counts were reported correct x2.   The patient was awakened from anesthesia and transferred to recovery in stable condition. There were no complications. The patient tolerated the procedure well. ______________________________    Hollice Espy, MD

## 2017-11-05 NOTE — Anesthesia Post-op Follow-up Note (Signed)
Anesthesia QCDR form completed.        

## 2017-11-05 NOTE — Anesthesia Postprocedure Evaluation (Signed)
Anesthesia Post Note  Patient: [name]  Procedure(s) Performed: HAND ASSISTED LAPAROSCOPIC NEPHRECTOMY (Left Abdomen) NEPHROSTOMY TUBE REMOVAL (Left Flank)  Patient location during evaluation: PACU Anesthesia Type: General Level of consciousness: awake and alert and oriented Pain management: pain level controlled Vital Signs Assessment: post-procedure vital signs reviewed and stable Respiratory status: spontaneous breathing, nonlabored ventilation and respiratory function stable Cardiovascular status: blood pressure returned to baseline and stable Postop Assessment: no signs of nausea or vomiting Anesthetic complications: no     Last Vitals:  Vitals:   11/05/17 1317 11/05/17 1331  BP: (!) 161/59 (!) 133/59  Pulse: 70 (!) 56  Resp: 15 (!) 9  Temp:    SpO2: 98% 94%    Last Pain:  Vitals:   11/05/17 1331  TempSrc:   PainSc: 1                  Ryanna Teschner

## 2017-11-05 NOTE — Anesthesia Procedure Notes (Signed)
Procedure Name: Intubation Date/Time: 11/05/2017 9:30 AM Performed by: Nelda Marseille, CRNA Pre-anesthesia Checklist: Patient identified, Patient being monitored, Timeout performed, Emergency Drugs available and Suction available Patient Re-evaluated:Patient Re-evaluated prior to induction Oxygen Delivery Method: Circle system utilized Preoxygenation: Pre-oxygenation with 100% oxygen Induction Type: IV induction Ventilation: Mask ventilation without difficulty Laryngoscope Size: Mac, 3 and McGraph Grade View: Grade II Tube type: Oral Tube size: 7.0 mm Number of attempts: 1 Airway Equipment and Method: Stylet Placement Confirmation: ETT inserted through vocal cords under direct vision,  positive ETCO2 and breath sounds checked- equal and bilateral Secured at: 21 cm Tube secured with: Tape Dental Injury: Teeth and Oropharynx as per pre-operative assessment

## 2017-11-06 ENCOUNTER — Encounter: Payer: Self-pay | Admitting: Urology

## 2017-11-06 LAB — TYPE AND SCREEN
ABO/RH(D): O POS
Antibody Screen: NEGATIVE
UNIT DIVISION: 0
Unit division: 0

## 2017-11-06 LAB — CBC
HEMATOCRIT: 35.3 % (ref 35.0–47.0)
HEMOGLOBIN: 12 g/dL (ref 12.0–16.0)
MCH: 31.5 pg (ref 26.0–34.0)
MCHC: 33.9 g/dL (ref 32.0–36.0)
MCV: 92.9 fL (ref 80.0–100.0)
Platelets: 136 10*3/uL — ABNORMAL LOW (ref 150–440)
RBC: 3.8 MIL/uL (ref 3.80–5.20)
RDW: 13.5 % (ref 11.5–14.5)
WBC: 7.4 10*3/uL (ref 3.6–11.0)

## 2017-11-06 LAB — BASIC METABOLIC PANEL
Anion gap: 6 (ref 5–15)
BUN: 28 mg/dL — AB (ref 8–23)
CHLORIDE: 108 mmol/L (ref 98–111)
CO2: 23 mmol/L (ref 22–32)
Calcium: 8.1 mg/dL — ABNORMAL LOW (ref 8.9–10.3)
Creatinine, Ser: 1.32 mg/dL — ABNORMAL HIGH (ref 0.44–1.00)
GFR calc Af Amer: 45 mL/min — ABNORMAL LOW (ref >60)
GFR calc non Af Amer: 38 mL/min — ABNORMAL LOW (ref >60)
GLUCOSE: 123 mg/dL — AB (ref 70–99)
POTASSIUM: 5 mmol/L (ref 3.5–5.1)
Sodium: 137 mmol/L (ref 135–145)

## 2017-11-06 LAB — BPAM RBC
Blood Product Expiration Date: 201909072359
Blood Product Expiration Date: 201909072359
UNIT TYPE AND RH: 5100
Unit Type and Rh: 5100

## 2017-11-06 MED ORDER — OXYCODONE-ACETAMINOPHEN 5-325 MG PO TABS: 1.0000 | ORAL_TABLET | ORAL | 0 refills | Status: DC | PRN

## 2017-11-06 MED ORDER — DOCUSATE SODIUM 100 MG PO CAPS: 100.0000 mg | ORAL_CAPSULE | Freq: Two times a day (BID) | ORAL | 0 refills | Status: DC

## 2017-11-06 NOTE — Discharge Summary (Signed)
Date of admission: 11/05/2017  Date of discharge: 11/06/2017  Admission diagnosis: Left renal atrophy, left  Discharge diagnosis: Same as above, CKD stage III  Secondary diagnoses:  Patient Active Problem List   Diagnosis Date Noted  . Left nephrolithiasis 11/05/2017    History and Physical: For full details, please see admission history and physical. Briefly, [name] is a 77 y.o. year old patient with multiple to undergo left hand-assisted laparoscopic nephrectomy.   Hospital Course: Patient tolerated the procedure well.  She was then transferred to the floor after an uneventful PACU stay.  Her hospital course was uncomplicated.  On POD#1 she had met discharge criteria: was eating a regular diet, was up and ambulating independently,  pain was well controlled, was voiding without a catheter, and was ready to for discharge.  Physical Exam  Constitutional: She appears well-developed and well-nourished.  HENT:  Head: Normocephalic and atraumatic.  Neck: Normal range of motion.  Cardiovascular: Normal rate.  Pulmonary/Chest: Effort normal. No respiratory distress.  Abdominal: Soft. She exhibits no distension.  Clean dry and intact, appropriately tender  Genitourinary:  Genitourinary Comments: Foley draining clear yellow urine removed this morning  Neurological: She is alert.  Skin: Skin is warm and dry.  Psychiatric: She has a normal mood and affect.  Vitals reviewed.    Laboratory values:  Recent Labs    11/06/17 0342  WBC 7.4  HGB 12.0  HCT 35.3   Recent Labs    11/06/17 0342  NA 137  K 5.0  CL 108  CO2 23  GLUCOSE 123*  BUN 28*  CREATININE 1.32*  CALCIUM 8.1*   No results for input(s): LABPT, INR in the last 72 hours. No results for input(s): LABURIN in the last 72 hours. Results for orders placed or performed during the hospital encounter of 10/22/17  Urine culture     Status: Abnormal   Collection Time: 10/22/17  8:57 AM  Result Value Ref Range  Status   Specimen Description   Final    URINE, CLEAN CATCH Performed at [facility], [address], [place], [place] [address]    Special Requests   Final    NONE Performed at [facility], [place], [place] [address]    Culture (A)  Final    >=100,000 COLONIES/mL STAPHYLOCOCCUS SPECIES (COAGULASE NEGATIVE)   Report Status 10/24/2017 FINAL  Final   Organism ID, Bacteria STAPHYLOCOCCUS SPECIES (COAGULASE NEGATIVE) (A)  Final      Susceptibility   Staphylococcus species (coagulase negative) - MIC*    CIPROFLOXACIN <=0.5 SENSITIVE Sensitive     GENTAMICIN <=0.5 SENSITIVE Sensitive     NITROFURANTOIN <=16 SENSITIVE Sensitive     OXACILLIN <=0.25 SENSITIVE Sensitive     TETRACYCLINE <=1 SENSITIVE Sensitive     VANCOMYCIN 1 SENSITIVE Sensitive     TRIMETH/SULFA <=10 SENSITIVE Sensitive     CLINDAMYCIN >=8 RESISTANT Resistant     RIFAMPIN <=0.5 SENSITIVE Sensitive     Inducible Clindamycin NEGATIVE Sensitive     * >=100,000 COLONIES/mL STAPHYLOCOCCUS SPECIES (COAGULASE NEGATIVE)    Disposition: Home  Discharge instruction:    Activity:  You are encouraged to ambulate frequently (about every hour during waking hours) to help prevent blood clots from forming in your legs or lungs.  However, you should not engage in any heavy lifting (> 5-10 lbs), strenuous activity, or straining.   Diet: You should advance your diet as instructed by your physician.  It will be normal to  have some bloating, nausea, and abdominal discomfort intermittently.   Prescriptions:  You will be provided a prescription for pain medication to take as needed.  If your pain is not severe enough to require the prescription pain medication, you may take extra strength Tylenol instead which will have less side effects.  You should also take a prescribed stool softener to avoid straining with bowel movements as the prescription pain medication may constipate you.   Incisions: You  may remove your dressing bandages 48 hours after surgery if not removed in the hospital.  You will either have some small staples or special tissue glue at each of the incision sites. Once the bandages are removed (if present), the incisions may stay open to air.  You may start showering (but not soaking or bathing in water) the 2nd day after surgery and the incisions simply need to be patted dry after the shower.  No additional care is needed.  What to call us about: You should call the office if you develop fever > 101 or develop persistent vomiting, redness or draining around your incision, or any other concerning symptoms.    [facility] [address] [place], [place] [address] [phone]    Discharge medications:  Allergies as of 11/06/2017      Reactions   Adhesive [tape] Other (See Comments)   Skin blisters if left on too long. Paper tape is okay   Raloxifene Other (See Comments)   (evista) Hot flashes      Medication List    TAKE these medications   acetaminophen 500 MG tablet Commonly known as:  TYLENOL Take 1,000 mg by mouth every 6 (six) hours as needed (for pain.).   cholecalciferol 1000 units tablet Commonly known as:  VITAMIN D Take 1,000 Units by mouth at bedtime.   docusate sodium 100 MG capsule Commonly known as:  COLACE Take 1 capsule (100 mg total) by mouth 2 (two) times daily.   ibuprofen 200 MG tablet Commonly known as:  ADVIL,MOTRIN Take 400 mg by mouth every 6 (six) hours as needed for headache or moderate pain.   lisinopril 20 MG tablet Commonly known as:  PRINIVIL,ZESTRIL Take 20 mg by mouth at bedtime.   lovastatin 20 MG tablet Commonly known as:  MEVACOR Take 20 mg by mouth at bedtime.   metoprolol succinate 100 MG 24 hr tablet Commonly known as:  TOPROL-XL Take 100 mg by mouth at bedtime.   oxyCODONE-acetaminophen 5-325 MG tablet Commonly known as:  PERCOCET/ROXICET Take 1-2 tablets by mouth every  4 (four) hours as needed for moderate pain or severe pain.   sodium chloride 0.9 % injection Inject 5 mLs into the vein every 8 (eight) hours.   sulfamethoxazole-trimethoprim 800-160 MG tablet Commonly known as:  BACTRIM DS,SEPTRA DS Take 1 tablet by mouth every 12 (twelve) hours.       Followup:  Follow-up Information    Hollice Espy, MD In 4 weeks.   Specialty:  Urology Why:  as scheduled Contact information: [place] [id number] [phone]

## 2017-11-06 NOTE — Progress Notes (Signed)
Initial Nutrition Assessment  DOCUMENTATION CODES:   Not applicable  INTERVENTION:   Recommend Ensure Enlive po BID, each supplement provides 350 kcal and 20 grams of protein  Recommend daily MVI  NUTRITION DIAGNOSIS:   Increased nutrient needs related to post-op healing as evidenced by increased estimated needs.  GOAL:   Patient will meet greater than or equal to 90% of their needs  MONITOR:   PO intake, Supplement acceptance, Labs, Weight trends, Skin, I & O's  REASON FOR ASSESSMENT:   Malnutrition Screening Tool    ASSESSMENT:   77 y/o female admitted for L nephrectomy secondary to atrophic left kidney with obstructing calculi   Pt admitted for scheduled nephrectomy. Pt chart, pt eating 70% of meals and appears weight stable pta. Recommend Ensure and MVI to support post op healing.   Medications reviewed and include: colace, heparin, NaCl @125ml /hr  Labs reviewed: BUN 28(H), creat 1.32(H)  Diet Order:   Diet Order            Diet regular Room service appropriate? Yes; Fluid consistency: Thin  Diet effective now             EDUCATION NEEDS:   No education needs have been identified at this time  Skin:  Skin Assessment: Reviewed RN Assessment(incision abdomen )  Last BM:  8/11  Height:   Ht Readings from Last 1 Encounters:  11/05/17 5' 4.5" (1.638 m)    Weight:   Wt Readings from Last 1 Encounters:  11/05/17 67.8 kg    Ideal Body Weight:  55.68 kg  BMI:  Body mass index is 25.25 kg/m.  Estimated Nutritional Needs:   Kcal:  1600-1900kcal/day   Protein:  68-75g/day   Fluid:  >1.6L/day   Koleen Distance MS, RD, LDN Pager #- [phone] Office#- [phone] After Hours Pager: [phone]

## 2017-11-06 NOTE — Progress Notes (Addendum)
Pt was given D/C instructions along with prescriptions. Her vitals were within normal limits and her IV removed without incident. She was released to her family.

## 2017-11-06 NOTE — Discharge Instructions (Signed)
·   Activity:  You are encouraged to ambulate frequently (about every hour during waking hours) to help prevent blood clots from forming in your legs or lungs.  However, you should not engage in any heavy lifting (> 5-10 lbs), strenuous activity, or straining. ° °· Diet: You should advance your diet as instructed by your physician.  It will be normal to have some bloating, nausea, and abdominal discomfort intermittently. ° °· Prescriptions:  You will be provided a prescription for pain medication to take as needed.  If your pain is not severe enough to require the prescription pain medication, you may take extra strength Tylenol instead which will have less side effects.  You should also take a prescribed stool softener to avoid straining with bowel movements as the prescription pain medication may constipate you. ° °· Incisions: You may remove your dressing bandages 48 hours after surgery if not removed in the hospital.  You will either have some small staples or special tissue glue at each of the incision sites. Once the bandages are removed (if present), the incisions may stay open to air.  You may start showering (but not soaking or bathing in water) the 2nd day after surgery and the incisions simply need to be patted dry after the shower.  No additional care is needed. ° °What to call us about: You should call the office if you develop fever > 101 or develop persistent vomiting, redness or draining around your incision, or any other concerning symptoms.   ° °[facility] °[address], [address] °[place], [place] [address] °[phone] ° ° °

## 2017-11-07 LAB — SURGICAL PATHOLOGY

## 2017-11-27 ENCOUNTER — Ambulatory Visit: Payer: Medicare HMO | Admitting: Urology

## 2017-12-04 ENCOUNTER — Ambulatory Visit (INDEPENDENT_AMBULATORY_CARE_PROVIDER_SITE_OTHER): Payer: Medicare HMO | Admitting: Urology

## 2017-12-04 ENCOUNTER — Encounter: Payer: Self-pay | Admitting: Urology

## 2017-12-04 VITALS — BP 132/78 | HR 61 | Ht 64.0 in | Wt 146.0 lb

## 2017-12-04 DIAGNOSIS — Q600 Renal agenesis, unilateral: Secondary | ICD-10-CM

## 2017-12-04 DIAGNOSIS — IMO0002 Reserved for concepts with insufficient information to code with codable children: Secondary | ICD-10-CM

## 2017-12-04 DIAGNOSIS — N261 Atrophy of kidney (terminal): Secondary | ICD-10-CM

## 2017-12-04 NOTE — Progress Notes (Signed)
12/04/2017 1:02 PM   [name] 04/21/1940 [id number]  Referring provider: Perrin Maltese, MD [place], [place] [address]  Chief Complaint  Patient presents with  . 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  Postoperatively, she is done very well.  No issues with her incision.  No redness or drainage.  She has minimal pain or discomfort.  She is slowly returning to normal activities.  She is voiding without difficulty.  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  There is a small fragment 1 cm of adrenal tissue.   PMH: Past Medical History:  Diagnosis Date  . 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 Chronic kidney disease 09/2017   left renal atrophy  . Dysrhythmia    PVCs  . History of kidney stones   . Hypertension     Surgical History: Past Surgical History:  Procedure Laterality Date  . COLONOSCOPY N/A 08/06/2014   Procedure: COLONOSCOPY;  Surgeon: Manya Silvas, MD;  Location: [facility] ENDOSCOPY;  Service: Endoscopy;  Laterality: N/A;  . IR NEPHROSTOMY PLACEMENT LEFT  09/24/2017  . LAPAROSCOPIC NEPHRECTOMY, HAND ASSISTED Left 11/05/2017   Procedure: HAND ASSISTED LAPAROSCOPIC NEPHRECTOMY;  Surgeon: Hollice Espy, MD;  Location: [facility] ORS;  Service: Urology;  Laterality: Left;  . NEPHROSTOMY TUBE REMOVAL Left 11/05/2017   Procedure: NEPHROSTOMY TUBE REMOVAL;  Surgeon: Hollice Espy, MD;  Location: [facility] ORS;  Service: Urology;  Laterality: Left;  . SHOULDER ARTHROSCOPY Right 2014  . TONSILLECTOMY    . TUBAL LIGATION      Home Medications:  Allergies as of 12/04/2017      Reactions   Adhesive [tape] Other (See Comments)   Skin blisters if left on too long. Paper tape is okay   Raloxifene Other (See Comments)   (evista)  Hot flashes      Medication List        Accurate as of 12/04/17  1:02 PM. Always use your most recent med list.          acetaminophen 500 MG tablet Commonly known as:  TYLENOL Take 1,000 mg by mouth every 6 (six) hours as needed (for pain.).   cholecalciferol 1000 units tablet Commonly known as:  VITAMIN D Take 1,000 Units by mouth at bedtime.   ibuprofen 200 MG tablet Commonly known as:  ADVIL,MOTRIN Take 400 mg by mouth every 6 (six) hours as needed for headache or moderate pain.   lisinopril 20 MG tablet Commonly known as:  PRINIVIL,ZESTRIL Take 20 mg by mouth at bedtime.   lovastatin 20 MG tablet Commonly known as:  MEVACOR Take 20 mg by mouth at bedtime.   metoprolol succinate 100 MG 24 hr tablet Commonly known as:  TOPROL-XL Take 100 mg by mouth at bedtime.       Allergies:  Allergies  Allergen Reactions  . Adhesive [Tape] Other (See Comments)    Skin blisters if left on too long. Paper tape is okay  . Raloxifene Other (See Comments)    (evista) Hot flashes    Family History: No family history on file.  Social History:  reports that she quit smoking about 42 years ago. Her smoking use included cigarettes. She has never used smokeless tobacco. She reports that she drinks about 7.0 standard drinks of alcohol per  week. She reports that she does not use drugs.  ROS: UROLOGY Frequent Urination?: No Hard to postpone urination?: No Burning/pain with urination?: No Get up at night to urinate?: Yes Leakage of urine?: No Urine stream starts and stops?: No Trouble starting stream?: No Do you have to strain to urinate?: No Blood in urine?: No Urinary tract infection?: No Sexually transmitted disease?: No Injury to kidneys or bladder?: No Painful intercourse?: No Weak stream?: No Currently pregnant?: No Vaginal bleeding?: No Last menstrual period?: n  Gastrointestinal Nausea?: No Vomiting?: No Indigestion/heartburn?: No Diarrhea?:  No Constipation?: No  Constitutional Fever: No Night sweats?: No Weight loss?: No Fatigue?: No  Skin Skin rash/lesions?: No Itching?: No  Eyes Blurred vision?: No Double vision?: No  Ears/Nose/Throat Sore throat?: No Sinus problems?: No  Hematologic/Lymphatic Swollen glands?: No Easy bruising?: Yes  Cardiovascular Leg swelling?: No Chest pain?: No  Respiratory Cough?: No Shortness of breath?: No  Endocrine Excessive thirst?: No  Musculoskeletal Back pain?: No Joint pain?: No  Neurological Headaches?: No Dizziness?: No  Psychologic Depression?: No Anxiety?: No  Physical Exam: BP 132/78   Pulse 61   Ht 5\' 4"  (1.626 m)   Wt 146 lb (66.2 kg)   BMI 25.06 kg/m   Constitutional:  Alert and oriented, No acute distress. HEENT: [place] AT, moist mucus membranes.  Trachea midline, no masses. Cardiovascular: No clubbing, cyanosis, or edema. Respiratory: Normal respiratory effort, no increased work of breathing. GI: Abdomen is soft, nontender, nondistended, no abdominal masses.  Incisions well-healed.  No hernias palpated. GU: Percutaneous nephrostomy tube site clean. Skin: No rashes, bruises or suspicious lesions. Neurologic: Grossly intact, no focal deficits, moving all 4 extremities. Psychiatric: Normal mood and affect.  Laboratory Data: Lab Results  Component Value Date   WBC 7.4 11/06/2017   HGB 12.0 11/06/2017   HCT 35.3 11/06/2017   MCV 92.9 11/06/2017   PLT 136 (L) 11/06/2017    Lab Results  Component Value Date   CREATININE 1.32 (H) 11/06/2017    Urinalysis N/a  Pertinent Imaging: n/a  Assessment & Plan:    1. Left renal atrophy Status post uncomplicated left nephrectomy for severely atrophic kidney Check BMP/CBC for new baseline Okay to return to regular activity - Basic metabolic panel - CBC with Differential/Platelet  2. Solitary kidney Solitary kidney cautions reviewed Avoid dehydration and NSAIDs   No follow-ups on  file.  Hollice Espy, MD  [facility] [address] [place], [place] [address] [phone]

## 2017-12-05 LAB — CBC WITH DIFFERENTIAL/PLATELET
Basophils Absolute: 0.1 10*3/uL (ref 0.0–0.2)
Basos: 1 %
EOS (ABSOLUTE): 0.2 10*3/uL (ref 0.0–0.4)
EOS: 4 %
HEMATOCRIT: 38.2 % (ref 34.0–46.6)
Hemoglobin: 12.3 g/dL (ref 11.1–15.9)
Immature Grans (Abs): 0 10*3/uL (ref 0.0–0.1)
Immature Granulocytes: 0 %
LYMPHS ABS: 1.5 10*3/uL (ref 0.7–3.1)
Lymphs: 27 %
MCH: 30.3 pg (ref 26.6–33.0)
MCHC: 32.2 g/dL (ref 31.5–35.7)
MCV: 94 fL (ref 79–97)
MONOS ABS: 0.5 10*3/uL (ref 0.1–0.9)
Monocytes: 9 %
Neutrophils Absolute: 3.3 10*3/uL (ref 1.4–7.0)
Neutrophils: 59 %
Platelets: 154 10*3/uL (ref 150–450)
RBC: 4.06 x10E6/uL (ref 3.77–5.28)
RDW: 13.1 % (ref 12.3–15.4)
WBC: 5.5 10*3/uL (ref 3.4–10.8)

## 2017-12-05 LAB — BASIC METABOLIC PANEL
BUN/Creatinine Ratio: 17 (ref 12–28)
BUN: 21 mg/dL (ref 8–27)
CALCIUM: 9.3 mg/dL (ref 8.7–10.3)
CO2: 24 mmol/L (ref 20–29)
CREATININE: 1.21 mg/dL — AB (ref 0.57–1.00)
Chloride: 102 mmol/L (ref 96–106)
GFR calc Af Amer: 51 mL/min/{1.73_m2} — ABNORMAL LOW (ref >59)
GFR, EST NON AFRICAN AMERICAN: 44 mL/min/{1.73_m2} — AB (ref >59)
Glucose: 86 mg/dL (ref 65–99)
Potassium: 4.5 mmol/L (ref 3.5–5.2)
Sodium: 142 mmol/L (ref 134–144)

## 2018-05-03 ENCOUNTER — Other Ambulatory Visit: Payer: Self-pay | Admitting: Internal Medicine

## 2018-05-03 DIAGNOSIS — I10 Essential (primary) hypertension: Secondary | ICD-10-CM

## 2018-05-08 ENCOUNTER — Ambulatory Visit
Admission: RE | Admit: 2018-05-08 | Discharge: 2018-05-08 | Disposition: A | Discharge status: Home / Self Care | Payer: Medicare HMO | Source: Ambulatory Visit | Attending: Internal Medicine | Admitting: Internal Medicine

## 2018-05-08 DIAGNOSIS — I10 Essential (primary) hypertension: Secondary | ICD-10-CM | POA: Diagnosis present

## 2018-12-12 IMAGING — NM NM RENAL IMAGING FLOW W/ PHARM
3 series · 18 of 18 positions shown · non-contrast
Comparison: None

CLINICAL DATA: LEFT hydronephrosis, history kidney stone,
nephrostomy tube

EXAM:
NUCLEAR MEDICINE RENAL SCAN WITH DIURETIC ADMINISTRATION
TECHNIQUE: Radionuclide angiographic and sequential renal images were obtained
after intravenous injection of radiopharmaceutical. Imaging was
continued during slow intravenous injection of Lasix approximately
15 minutes after the start of the examination. Nephrostomy tube was
clamped during imaging.
RADIOPHARMACEUTICALS:  5.5 mCi 1echnetium-EEm MAG3 IV
Pharmaceutical: Lasix 34 mg IV 20 minutes into exam.

[Series 1000: lasix renal mag 3 (first dynamic renal results) · 7.79mm/px · 6 of 114 frames shown]
[frame 10/114]
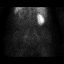
[frame 29/114]
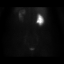
[frame 48/114]
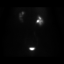
[frame 67/114]
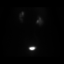
[frame 86/114]
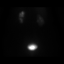
[frame 105/114]
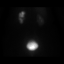

[Series 1000: lasix renal mag 3 · 7.79mm/px · 6 of 114 frames shown]
[frame 10/114]
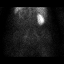
[frame 29/114]
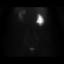
[frame 48/114]
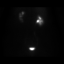
[frame 67/114]
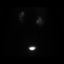
[frame 86/114]
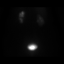
[frame 105/114]
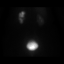

[Series 1000: lasix renal mag 3 (results) · 7.79mm/px · 6 of 114 frames shown]
[frame 10/114]
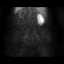
[frame 29/114]
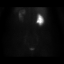
[frame 48/114]
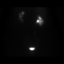
[frame 67/114]
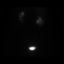
[frame 86/114]
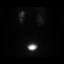
[frame 105/114]
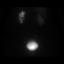

[18 of 18 positions shown; findings below may reference images not displayed]

FINDINGS: Flow: Prompt normal blood flow to RIGHT kidney. Diminished and
delayed flow to the LEFT kidney

Left renogram: Decreased uptake, concentration and excretion of
tracer by RIGHT kidney. Thin renal cortex early in the exam. Slow
excretion into a markedly dilated collecting system. By the
conclusion of the exam, tracer is located within primarily within
dilated renal calices, little in renal pelvis. No appreciable
excretion of tracer during the study. Analysis of the renogram curve
shows markedly delayed time to maximum activity, at the conclusion
of the exam.

No appreciable excretion.

Right renogram: Normal uptake, concentration and excretion of tracer
by RIGHT kidney. RIGHT kidney demonstrates a dilated collecting
system. Good washout of tracer from the dilated collecting system
begins prior to Lasix administration and accelerate following Lasix.
Slight delayed time to peak activity of 8.5 minutes, likely due to
dilated collecting system, with fall to half maximum activity at
approximately 21 minutes. Significant retained tracer at conclusion
of exam. No ureteral tracer retention is seen. Findings most likely
reflect UPJ obstruction.

Differential:

Left kidney = 16 %

Right kidney = 84 %

T1/2 post Lasix :

Left kidney = N/A min

Right kidney = 26.2 min
IMPRESSION: Mildly dilated RIGHT renal collecting system.

Otherwise normal RIGHT renogram.

Markedly dilated LEFT renal collecting system with severely impaired
renal function, with delayed uptake and concentration of tracer and
markedly impaired excretion of tracer.

Findings are consistent with high-grade obstruction, likely at UPJ.

## 2018-12-19 DIAGNOSIS — L570 Actinic keratosis: Secondary | ICD-10-CM

## 2018-12-19 HISTORY — DX: Actinic keratosis: L57.0

## 2019-08-18 ENCOUNTER — Ambulatory Visit: Payer: Medicare HMO | Admitting: Dermatology

## 2019-08-18 ENCOUNTER — Other Ambulatory Visit: Payer: Self-pay

## 2019-08-18 DIAGNOSIS — D485 Neoplasm of uncertain behavior of skin: Secondary | ICD-10-CM | POA: Diagnosis not present

## 2019-08-18 DIAGNOSIS — D492 Neoplasm of unspecified behavior of bone, soft tissue, and skin: Secondary | ICD-10-CM

## 2019-08-18 NOTE — Patient Instructions (Signed)

## 2019-08-18 NOTE — Progress Notes (Signed)
   Follow-Up Visit   Subjective  [name] is a 79 y.o. female who presents for the following: Actinic Keratosis (Recheck Biopsy proven AK at R lateral antecubital, treated with LN2 4 months ago still not gone away ).  The following portions of the chart were reviewed this encounter and updated as appropriate:  Tobacco  Allergies  Meds  Problems  Med Hx  Surg Hx  Fam Hx      Review of Systems:  No other skin or systemic complaints except as noted in HPI or Assessment and Plan.  Objective  Well appearing patient in no apparent distress; mood and affect are within normal limits.  A focused examination was performed including R arm. Relevant physical exam findings are noted in the Assessment and Plan.  Objective  Right Antecubital Fossa: 2.1 cm hyperkeratotic papule    Assessment & Plan  Neoplasm of skin Right Antecubital Fossa  Epidermal / dermal shaving  Lesion length (cm):  2.1 Lesion width (cm):  2.1 Margin per side (cm):  0.2 Total excision diameter (cm):  2.5 Informed consent: discussed and consent obtained   Timeout: patient name, date of birth, surgical site, and procedure verified   Procedure prep:  Patient was prepped and draped in usual sterile fashion Prep type:  Isopropyl alcohol Anesthesia: the lesion was anesthetized in a standard fashion   Anesthetic:  1% lidocaine w/ epinephrine 1-100,000 buffered w/ 8.4% NaHCO3 Hemostasis achieved with: pressure, aluminum chloride and electrodesiccation   Outcome: patient tolerated procedure well   Post-procedure details: sterile dressing applied and wound care instructions given   Dressing type: bandage and petrolatum    Destruction of lesion Complexity: extensive   Destruction method: electrodesiccation and curettage   Informed consent: discussed and consent obtained   Timeout:  patient name, date of birth, surgical site, and procedure verified Procedure prep:  Patient was prepped and draped in usual  sterile fashion Prep type:  Isopropyl alcohol Anesthesia: the lesion was anesthetized in a standard fashion   Anesthetic:  1% lidocaine w/ epinephrine 1-100,000 buffered w/ 8.4% NaHCO3 Curettage performed in three different directions: Yes   Electrodesiccation performed over the curetted area: Yes   Lesion length (cm):  2.1 Lesion width (cm):  2.1 Margin per side (cm):  0.2 Final wound size (cm):  2.5 Hemostasis achieved with:  pressure, aluminum chloride and electrodesiccation Outcome: patient tolerated procedure well with no complications   Post-procedure details: sterile dressing applied and wound care instructions given   Dressing type: bandage and petrolatum    Specimen 1 - Surgical pathology Differential Diagnosis: AK vs skin cancer  Check Margins: No 1.2 cm hyperkeratotic papule2 pieces in bottle   Return in about 3 months (around 11/18/2019).  IMarye Round, CMA, am acting as scribe for Sarina Ser, MD .  Documentation: I have reviewed the above documentation for accuracy and completeness, and I agree with the above.  Sarina Ser, MD

## 2019-08-19 ENCOUNTER — Encounter: Payer: Self-pay | Admitting: Dermatology

## 2019-08-21 ENCOUNTER — Telehealth: Payer: Self-pay

## 2019-08-21 NOTE — Telephone Encounter (Signed)
-----   Message from Ralene Bathe, MD sent at 08/20/2019  5:42 PM EDT ----- Skin , right antecubital fossa SEBORRHEIC KERATOSIS, IRRITATED, BASE INVOLVED  Benign keratosis

## 2019-08-21 NOTE — Telephone Encounter (Signed)
Discussed biopsy results with pt  °

## 2019-11-17 ENCOUNTER — Ambulatory Visit: Payer: Medicare HMO | Admitting: Dermatology

## 2019-11-17 ENCOUNTER — Other Ambulatory Visit: Payer: Self-pay

## 2019-11-17 DIAGNOSIS — L905 Scar conditions and fibrosis of skin: Secondary | ICD-10-CM

## 2019-11-17 DIAGNOSIS — L821 Other seborrheic keratosis: Secondary | ICD-10-CM

## 2019-11-17 NOTE — Progress Notes (Signed)
   Follow-Up Visit   Subjective  [name] is a 79 y.o. female who presents for the following: Follow-up.  Patient presents today for follow up on OV 08/18/19 for bx proven SK on Right antecubital fossa. Patient states bx site has healed well. Patient would also like to have right forearm evaluated.   The following portions of the chart were reviewed this encounter and updated as appropriate:  Tobacco  Allergies  Meds  Problems  Med Hx  Surg Hx  Fam Hx      Review of Systems:  No other skin or systemic complaints except as noted in HPI or Assessment and Plan.  Objective  Well appearing patient in no apparent distress; mood and affect are within normal limits.  A focused examination was performed including Right arm. Relevant physical exam findings are noted in the Assessment and Plan.  Objective  Right Forearm - Anterior: Dyspigmented smooth macule or patch.   Objective  Right Antecubital Fossa: Stuck-on, waxy, tan-brown papules and plaques -- Discussed benign etiology and prognosis.    Assessment & Plan  Scar Right Forearm - Anterior  Traumatic injury with sutures due to dog on 09/27/19   Start OTC Serica scar gel once daily  Seborrheic keratosis Right Antecubital Fossa  Bx proven 08/18/19, clear today.  Return for As Scheduled.  Marene Lenz, CMA, am acting as scribe for Sarina Ser, MD . Documentation: I have reviewed the above documentation for accuracy and completeness, and I agree with the above.  Sarina Ser, MD

## 2019-11-17 NOTE — Patient Instructions (Signed)
Recommend daily broad spectrum sunscreen SPF 30+ to sun-exposed areas, reapply every 2 hours as needed. Call for new or changing lesions.  

## 2019-11-24 ENCOUNTER — Encounter: Payer: Self-pay | Admitting: Dermatology

## 2020-04-22 ENCOUNTER — Encounter: Payer: Medicare HMO | Admitting: Dermatology

## 2020-05-28 DIAGNOSIS — I10 Essential (primary) hypertension: Secondary | ICD-10-CM | POA: Diagnosis not present

## 2020-05-28 DIAGNOSIS — M8589 Other specified disorders of bone density and structure, multiple sites: Secondary | ICD-10-CM | POA: Diagnosis not present

## 2020-05-28 DIAGNOSIS — R002 Palpitations: Secondary | ICD-10-CM | POA: Diagnosis not present

## 2020-05-28 DIAGNOSIS — E782 Mixed hyperlipidemia: Secondary | ICD-10-CM | POA: Diagnosis not present

## 2020-06-08 DIAGNOSIS — E782 Mixed hyperlipidemia: Secondary | ICD-10-CM | POA: Diagnosis not present

## 2020-06-08 DIAGNOSIS — R002 Palpitations: Secondary | ICD-10-CM | POA: Diagnosis not present

## 2020-06-08 DIAGNOSIS — Z79899 Other long term (current) drug therapy: Secondary | ICD-10-CM | POA: Diagnosis not present

## 2020-06-08 DIAGNOSIS — R0602 Shortness of breath: Secondary | ICD-10-CM | POA: Diagnosis not present

## 2020-06-08 DIAGNOSIS — N959 Unspecified menopausal and perimenopausal disorder: Secondary | ICD-10-CM | POA: Diagnosis not present

## 2020-06-08 DIAGNOSIS — R29890 Loss of height: Secondary | ICD-10-CM | POA: Diagnosis not present

## 2020-06-08 DIAGNOSIS — I340 Nonrheumatic mitral (valve) insufficiency: Secondary | ICD-10-CM | POA: Diagnosis not present

## 2020-06-08 DIAGNOSIS — I10 Essential (primary) hypertension: Secondary | ICD-10-CM | POA: Diagnosis not present

## 2020-06-11 DIAGNOSIS — E785 Hyperlipidemia, unspecified: Secondary | ICD-10-CM | POA: Diagnosis not present

## 2020-06-11 DIAGNOSIS — Z87891 Personal history of nicotine dependence: Secondary | ICD-10-CM | POA: Diagnosis not present

## 2020-06-11 DIAGNOSIS — I499 Cardiac arrhythmia, unspecified: Secondary | ICD-10-CM | POA: Diagnosis not present

## 2020-06-11 DIAGNOSIS — I10 Essential (primary) hypertension: Secondary | ICD-10-CM | POA: Diagnosis not present

## 2020-06-11 DIAGNOSIS — Z85828 Personal history of other malignant neoplasm of skin: Secondary | ICD-10-CM | POA: Diagnosis not present

## 2020-06-14 DIAGNOSIS — R0602 Shortness of breath: Secondary | ICD-10-CM | POA: Diagnosis not present

## 2020-06-14 DIAGNOSIS — R002 Palpitations: Secondary | ICD-10-CM | POA: Diagnosis not present

## 2020-06-14 DIAGNOSIS — I10 Essential (primary) hypertension: Secondary | ICD-10-CM | POA: Diagnosis not present

## 2020-06-14 DIAGNOSIS — I340 Nonrheumatic mitral (valve) insufficiency: Secondary | ICD-10-CM | POA: Diagnosis not present

## 2020-06-22 DIAGNOSIS — I10 Essential (primary) hypertension: Secondary | ICD-10-CM | POA: Diagnosis not present

## 2020-06-22 DIAGNOSIS — R0602 Shortness of breath: Secondary | ICD-10-CM | POA: Diagnosis not present

## 2020-06-22 DIAGNOSIS — R002 Palpitations: Secondary | ICD-10-CM | POA: Diagnosis not present

## 2020-07-20 DIAGNOSIS — Z1231 Encounter for screening mammogram for malignant neoplasm of breast: Secondary | ICD-10-CM | POA: Diagnosis not present

## 2020-08-09 DIAGNOSIS — R928 Other abnormal and inconclusive findings on diagnostic imaging of breast: Secondary | ICD-10-CM | POA: Diagnosis not present

## 2020-08-09 DIAGNOSIS — R922 Inconclusive mammogram: Secondary | ICD-10-CM | POA: Diagnosis not present

## 2020-09-15 DIAGNOSIS — H52229 Regular astigmatism, unspecified eye: Secondary | ICD-10-CM | POA: Diagnosis not present

## 2020-09-16 DIAGNOSIS — M1611 Unilateral primary osteoarthritis, right hip: Secondary | ICD-10-CM | POA: Diagnosis not present

## 2020-09-28 DIAGNOSIS — R002 Palpitations: Secondary | ICD-10-CM | POA: Diagnosis not present

## 2020-09-28 DIAGNOSIS — I10 Essential (primary) hypertension: Secondary | ICD-10-CM | POA: Diagnosis not present

## 2020-10-19 DIAGNOSIS — E782 Mixed hyperlipidemia: Secondary | ICD-10-CM | POA: Diagnosis not present

## 2020-10-19 DIAGNOSIS — N289 Disorder of kidney and ureter, unspecified: Secondary | ICD-10-CM | POA: Diagnosis not present

## 2020-10-19 DIAGNOSIS — I10 Essential (primary) hypertension: Secondary | ICD-10-CM | POA: Diagnosis not present

## 2020-10-19 DIAGNOSIS — R002 Palpitations: Secondary | ICD-10-CM | POA: Diagnosis not present

## 2020-11-05 DIAGNOSIS — I10 Essential (primary) hypertension: Secondary | ICD-10-CM | POA: Diagnosis not present

## 2020-11-05 DIAGNOSIS — E782 Mixed hyperlipidemia: Secondary | ICD-10-CM | POA: Diagnosis not present

## 2020-11-05 DIAGNOSIS — R002 Palpitations: Secondary | ICD-10-CM | POA: Diagnosis not present

## 2021-01-25 DIAGNOSIS — R002 Palpitations: Secondary | ICD-10-CM | POA: Diagnosis not present

## 2021-01-25 DIAGNOSIS — E782 Mixed hyperlipidemia: Secondary | ICD-10-CM | POA: Diagnosis not present

## 2021-01-25 DIAGNOSIS — I340 Nonrheumatic mitral (valve) insufficiency: Secondary | ICD-10-CM | POA: Diagnosis not present

## 2021-01-25 DIAGNOSIS — I10 Essential (primary) hypertension: Secondary | ICD-10-CM | POA: Diagnosis not present

## 2021-02-27 DIAGNOSIS — Z20822 Contact with and (suspected) exposure to covid-19: Secondary | ICD-10-CM | POA: Diagnosis not present

## 2021-02-28 DIAGNOSIS — Z20822 Contact with and (suspected) exposure to covid-19: Secondary | ICD-10-CM | POA: Diagnosis not present

## 2021-03-01 DIAGNOSIS — Z20822 Contact with and (suspected) exposure to covid-19: Secondary | ICD-10-CM | POA: Diagnosis not present

## 2021-03-08 DIAGNOSIS — I10 Essential (primary) hypertension: Secondary | ICD-10-CM | POA: Diagnosis not present

## 2021-03-08 DIAGNOSIS — E782 Mixed hyperlipidemia: Secondary | ICD-10-CM | POA: Diagnosis not present

## 2021-03-08 DIAGNOSIS — N289 Disorder of kidney and ureter, unspecified: Secondary | ICD-10-CM | POA: Diagnosis not present

## 2021-03-28 DIAGNOSIS — Z20822 Contact with and (suspected) exposure to covid-19: Secondary | ICD-10-CM | POA: Diagnosis not present

## 2021-03-29 DIAGNOSIS — Z20822 Contact with and (suspected) exposure to covid-19: Secondary | ICD-10-CM | POA: Diagnosis not present

## 2021-03-30 DIAGNOSIS — Z20822 Contact with and (suspected) exposure to covid-19: Secondary | ICD-10-CM | POA: Diagnosis not present

## 2021-05-17 DIAGNOSIS — Z9181 History of falling: Secondary | ICD-10-CM | POA: Diagnosis not present

## 2021-05-17 DIAGNOSIS — Z833 Family history of diabetes mellitus: Secondary | ICD-10-CM | POA: Diagnosis not present

## 2021-05-17 DIAGNOSIS — Z87891 Personal history of nicotine dependence: Secondary | ICD-10-CM | POA: Diagnosis not present

## 2021-05-17 DIAGNOSIS — I10 Essential (primary) hypertension: Secondary | ICD-10-CM | POA: Diagnosis not present

## 2021-05-17 DIAGNOSIS — E785 Hyperlipidemia, unspecified: Secondary | ICD-10-CM | POA: Diagnosis not present

## 2021-05-17 DIAGNOSIS — Z8249 Family history of ischemic heart disease and other diseases of the circulatory system: Secondary | ICD-10-CM | POA: Diagnosis not present

## 2021-05-17 DIAGNOSIS — Z008 Encounter for other general examination: Secondary | ICD-10-CM | POA: Diagnosis not present

## 2021-06-07 DIAGNOSIS — I10 Essential (primary) hypertension: Secondary | ICD-10-CM | POA: Diagnosis not present

## 2021-06-07 DIAGNOSIS — I340 Nonrheumatic mitral (valve) insufficiency: Secondary | ICD-10-CM | POA: Diagnosis not present

## 2021-06-07 DIAGNOSIS — R002 Palpitations: Secondary | ICD-10-CM | POA: Diagnosis not present

## 2021-06-07 DIAGNOSIS — E782 Mixed hyperlipidemia: Secondary | ICD-10-CM | POA: Diagnosis not present

## 2021-08-05 DIAGNOSIS — E049 Nontoxic goiter, unspecified: Secondary | ICD-10-CM | POA: Diagnosis not present

## 2021-08-05 DIAGNOSIS — I10 Essential (primary) hypertension: Secondary | ICD-10-CM | POA: Diagnosis not present

## 2021-08-05 DIAGNOSIS — E782 Mixed hyperlipidemia: Secondary | ICD-10-CM | POA: Diagnosis not present

## 2021-08-18 DIAGNOSIS — Z1231 Encounter for screening mammogram for malignant neoplasm of breast: Secondary | ICD-10-CM | POA: Diagnosis not present

## 2021-09-15 DIAGNOSIS — H52223 Regular astigmatism, bilateral: Secondary | ICD-10-CM | POA: Diagnosis not present

## 2021-10-14 DIAGNOSIS — I10 Essential (primary) hypertension: Secondary | ICD-10-CM | POA: Diagnosis not present

## 2021-10-14 DIAGNOSIS — E782 Mixed hyperlipidemia: Secondary | ICD-10-CM | POA: Diagnosis not present

## 2021-10-14 DIAGNOSIS — I340 Nonrheumatic mitral (valve) insufficiency: Secondary | ICD-10-CM | POA: Diagnosis not present

## 2021-10-14 DIAGNOSIS — E669 Obesity, unspecified: Secondary | ICD-10-CM | POA: Diagnosis not present

## 2021-10-14 DIAGNOSIS — R002 Palpitations: Secondary | ICD-10-CM | POA: Diagnosis not present

## 2021-10-17 DIAGNOSIS — M1611 Unilateral primary osteoarthritis, right hip: Secondary | ICD-10-CM | POA: Diagnosis not present

## 2021-10-28 DIAGNOSIS — J3089 Other allergic rhinitis: Secondary | ICD-10-CM | POA: Diagnosis not present

## 2021-10-28 DIAGNOSIS — J300 Vasomotor rhinitis: Secondary | ICD-10-CM | POA: Diagnosis not present

## 2021-10-28 DIAGNOSIS — J301 Allergic rhinitis due to pollen: Secondary | ICD-10-CM | POA: Diagnosis not present

## 2021-11-09 DIAGNOSIS — R2689 Other abnormalities of gait and mobility: Secondary | ICD-10-CM | POA: Diagnosis not present

## 2021-11-09 DIAGNOSIS — M25551 Pain in right hip: Secondary | ICD-10-CM | POA: Diagnosis not present

## 2021-11-14 DIAGNOSIS — M25551 Pain in right hip: Secondary | ICD-10-CM | POA: Diagnosis not present

## 2021-11-14 DIAGNOSIS — R2689 Other abnormalities of gait and mobility: Secondary | ICD-10-CM | POA: Diagnosis not present

## 2021-11-21 DIAGNOSIS — M25551 Pain in right hip: Secondary | ICD-10-CM | POA: Diagnosis not present

## 2021-11-21 DIAGNOSIS — R2689 Other abnormalities of gait and mobility: Secondary | ICD-10-CM | POA: Diagnosis not present

## 2021-11-23 DIAGNOSIS — M25551 Pain in right hip: Secondary | ICD-10-CM | POA: Diagnosis not present

## 2021-11-23 DIAGNOSIS — R2689 Other abnormalities of gait and mobility: Secondary | ICD-10-CM | POA: Diagnosis not present

## 2021-11-29 DIAGNOSIS — R2689 Other abnormalities of gait and mobility: Secondary | ICD-10-CM | POA: Diagnosis not present

## 2021-11-29 DIAGNOSIS — M25551 Pain in right hip: Secondary | ICD-10-CM | POA: Diagnosis not present

## 2021-12-01 DIAGNOSIS — M25551 Pain in right hip: Secondary | ICD-10-CM | POA: Diagnosis not present

## 2021-12-01 DIAGNOSIS — R2689 Other abnormalities of gait and mobility: Secondary | ICD-10-CM | POA: Diagnosis not present

## 2021-12-02 DIAGNOSIS — M1611 Unilateral primary osteoarthritis, right hip: Secondary | ICD-10-CM | POA: Diagnosis not present

## 2021-12-06 DIAGNOSIS — R2689 Other abnormalities of gait and mobility: Secondary | ICD-10-CM | POA: Diagnosis not present

## 2021-12-06 DIAGNOSIS — M25551 Pain in right hip: Secondary | ICD-10-CM | POA: Diagnosis not present

## 2021-12-09 DIAGNOSIS — M25551 Pain in right hip: Secondary | ICD-10-CM | POA: Diagnosis not present

## 2021-12-09 DIAGNOSIS — R2689 Other abnormalities of gait and mobility: Secondary | ICD-10-CM | POA: Diagnosis not present

## 2022-01-02 ENCOUNTER — Ambulatory Visit: Payer: Medicare HMO | Admitting: Dermatology

## 2022-01-02 DIAGNOSIS — D229 Melanocytic nevi, unspecified: Secondary | ICD-10-CM | POA: Diagnosis not present

## 2022-01-02 DIAGNOSIS — I781 Nevus, non-neoplastic: Secondary | ICD-10-CM | POA: Diagnosis not present

## 2022-01-02 DIAGNOSIS — L814 Other melanin hyperpigmentation: Secondary | ICD-10-CM | POA: Diagnosis not present

## 2022-01-02 DIAGNOSIS — Z85828 Personal history of other malignant neoplasm of skin: Secondary | ICD-10-CM | POA: Diagnosis not present

## 2022-01-02 DIAGNOSIS — L578 Other skin changes due to chronic exposure to nonionizing radiation: Secondary | ICD-10-CM | POA: Diagnosis not present

## 2022-01-02 DIAGNOSIS — L821 Other seborrheic keratosis: Secondary | ICD-10-CM | POA: Diagnosis not present

## 2022-01-02 DIAGNOSIS — L820 Inflamed seborrheic keratosis: Secondary | ICD-10-CM

## 2022-01-02 DIAGNOSIS — Z1283 Encounter for screening for malignant neoplasm of skin: Secondary | ICD-10-CM

## 2022-01-02 DIAGNOSIS — D692 Other nonthrombocytopenic purpura: Secondary | ICD-10-CM | POA: Diagnosis not present

## 2022-01-02 NOTE — Progress Notes (Unsigned)
Follow-Up Visit   Subjective  [name] is a 81 y.o. female who presents for the following: Annual Exam (History of SCC - The patient presents for Total-Body Skin Exam (TBSE) for skin cancer screening and mole check.  The patient has spots, moles and lesions to be evaluated, some may be new or changing and the patient has concerns that these could be cancer.//).  The following portions of the chart were reviewed this encounter and updated as appropriate:   Tobacco  Allergies  Meds  Problems  Med Hx  Surg Hx  Fam Hx     Review of Systems:  No other skin or systemic complaints except as noted in HPI or Assessment and Plan.  Objective  Well appearing patient in no apparent distress; mood and affect are within normal limits.  A full examination was performed including scalp, head, eyes, ears, nose, lips, neck, chest, axillae, abdomen, back, buttocks, bilateral upper extremities, bilateral lower extremities, hands, feet, fingers, toes, fingernails, and toenails. All findings within normal limits unless otherwise noted below.  Right medial upper lip Dilated blood vessel  Right Lower Leg - Posterior Erythematous stuck-on, waxy papule or plaque   Assessment & Plan   History of Squamous Cell Carcinoma of the Skin - No evidence of recurrence today - No lymphadenopathy - Recommend regular full body skin exams - Recommend daily broad spectrum sunscreen SPF 30+ to sun-exposed areas, reapply every 2 hours as needed.  - Call if any new or changing lesions are noted between office visits  Purpura - Chronic; persistent and recurrent.  Treatable, but not curable. - Violaceous macules and patches - Benign - Related to trauma, age, sun damage and/or use of blood thinners, chronic use of topical and/or oral steroids - Observe - Can use OTC arnica containing moisturizer such as Dermend Bruise Formula if desired - Call for worsening or other concerns  Lentigines - Scattered tan  macules - Due to sun exposure - Benign-appearing, observe - Recommend daily broad spectrum sunscreen SPF 30+ to sun-exposed areas, reapply every 2 hours as needed. - Call for any changes  Seborrheic Keratoses - Stuck-on, waxy, tan-brown papules and/or plaques  - Benign-appearing - Discussed benign etiology and prognosis. - Observe - Call for any changes  Melanocytic Nevi - Tan-brown and/or pink-flesh-colored symmetric macules and papules - Benign appearing on exam today - Observation - Call clinic for new or changing moles - Recommend daily use of broad spectrum spf 30+ sunscreen to sun-exposed areas.   Hemangiomas - Red papules - Discussed benign nature - Observe - Call for any changes  Actinic Damage - Chronic condition, secondary to cumulative UV/sun exposure - diffuse scaly erythematous macules with underlying dyspigmentation - Recommend daily broad spectrum sunscreen SPF 30+ to sun-exposed areas, reapply every 2 hours as needed.  - Staying in the shade or wearing long sleeves, sun glasses (UVA+UVB protection) and wide brim hats (4-inch brim around the entire circumference of the hat) are also recommended for sun protection.  - Call for new or changing lesions.  Skin cancer screening performed today.  Telangiectasia Right medial upper lip Benign, observe.   Inflamed seborrheic keratosis Right Lower Leg - Posterior Destruction of lesion - Right Lower Leg - Posterior Complexity: simple   Destruction method: cryotherapy   Informed consent: discussed and consent obtained   Timeout:  patient name, date of birth, surgical site, and procedure verified Lesion destroyed using liquid nitrogen: Yes   Region frozen until ice ball extended beyond lesion:  Yes   Outcome: patient tolerated procedure well with no complications   Post-procedure details: wound care instructions given    Return in about 1 year (around 01/03/2023) for TBSE.  I, Ashok Cordia, CMA, am acting as scribe  for Sarina Ser, MD . Documentation: I have reviewed the above documentation for accuracy and completeness, and I agree with the above.  Sarina Ser, MD

## 2022-01-02 NOTE — Patient Instructions (Addendum)
Cryotherapy Aftercare  Wash gently with soap and water everyday.   Apply Vaseline and Band-Aid daily until healed.     Due to recent changes in healthcare laws, you may see results of your pathology and/or laboratory studies on MyChart before the doctors have had a chance to review them. We understand that in some cases there may be results that are confusing or concerning to you. Please understand that not all results are received at the same time and often the doctors may need to interpret multiple results in order to provide you with the best plan of care or course of treatment. Therefore, we ask that you please give us 2 business days to thoroughly review all your results before contacting the office for clarification. Should we see a critical lab result, you will be contacted sooner.   If You Need Anything After Your Visit  If you have any questions or concerns for your doctor, please call our main line at [phone] and press option 4 to reach your doctor's medical assistant. If no one answers, please leave a voicemail as directed and we will return your call as soon as possible. Messages left after 4 pm will be answered the following business day.   You may also send us a message via MyChart. We typically respond to MyChart messages within 1-2 business days.  For prescription refills, please ask your pharmacy to contact our office. Our fax number is [phone].  If you have an urgent issue when the clinic is closed that cannot wait until the next business day, you can page your doctor at the number below.    Please note that while we do our best to be available for urgent issues outside of office hours, we are not available 24/7.   If you have an urgent issue and are unable to reach us, you may choose to seek medical care at your doctor's office, retail clinic, urgent care center, or emergency room.  If you have a medical emergency, please immediately call 911 or go to the  emergency department.  Pager Numbers  - Dr. Kowalski: [phone]  - Dr. Moye: [phone]  - Dr. Stewart: [phone]  In the event of inclement weather, please call our main line at [phone] for an update on the status of any delays or closures.  Dermatology Medication Tips: Please keep the boxes that topical medications come in in order to help keep track of the instructions about where and how to use these. Pharmacies typically print the medication instructions only on the boxes and not directly on the medication tubes.   If your medication is too expensive, please contact our office at [phone] option 4 or send us a message through MyChart.   We are unable to tell what your co-pay for medications will be in advance as this is different depending on your insurance coverage. However, we may be able to find a substitute medication at lower cost or fill out paperwork to get insurance to cover a needed medication.   If a prior authorization is required to get your medication covered by your insurance company, please allow us 1-2 business days to complete this process.  Drug prices often vary depending on where the prescription is filled and some pharmacies may offer cheaper prices.  The website www.goodrx.com contains coupons for medications through different pharmacies. The prices here do not account for what the cost may be with help from insurance (it may be cheaper with your insurance), but the website can   give you the price if you did not use any insurance.  - You can print the associated coupon and take it with your prescription to the pharmacy.  - You may also stop by our office during regular business hours and pick up a GoodRx coupon card.  - If you need your prescription sent electronically to a different pharmacy, notify our office through [facility] MyChart or by phone at [phone] option 4.     Si Usted Necesita Algo Despus de Su Visita  Tambin puede  enviarnos un mensaje a travs de MyChart. Por lo general respondemos a los mensajes de MyChart en el transcurso de 1 a 2 das hbiles.  Para renovar recetas, por favor pida a su farmacia que se ponga en contacto con nuestra oficina. Nuestro nmero de fax es el [phone].  Si tiene un asunto urgente cuando la clnica est cerrada y que no puede esperar hasta el siguiente da hbil, puede llamar/localizar a su doctor(a) al nmero que aparece a continuacin.   Por favor, tenga en cuenta que aunque hacemos todo lo posible para estar disponibles para asuntos urgentes fuera del horario de oficina, no estamos disponibles las 24 horas del da, los 7 das de la semana.   Si tiene un problema urgente y no puede comunicarse con nosotros, puede optar por buscar atencin mdica  en el consultorio de su doctor(a), en una clnica privada, en un centro de atencin urgente o en una sala de emergencias.  Si tiene una emergencia mdica, por favor llame inmediatamente al 911 o vaya a la sala de emergencias.  Nmeros de bper  - Dr. Kowalski: [phone]  - Dra. Moye: [phone]  - Dra. Stewart: [phone]  En caso de inclemencias del tiempo, por favor llame a nuestra lnea principal al [phone] para una actualizacin sobre el estado de cualquier retraso o cierre.  Consejos para la medicacin en dermatologa: Por favor, guarde las cajas en las que vienen los medicamentos de uso tpico para ayudarle a seguir las instrucciones sobre dnde y cmo usarlos. Las farmacias generalmente imprimen las instrucciones del medicamento slo en las cajas y no directamente en los tubos del medicamento.   Si su medicamento es muy caro, por favor, pngase en contacto con nuestra oficina llamando al [phone] y presione la opcin 4 o envenos un mensaje a travs de MyChart.   No podemos decirle cul ser su copago por los medicamentos por adelantado ya que esto es diferente dependiendo de la cobertura de su seguro.  Sin embargo, es posible que podamos encontrar un medicamento sustituto a menor costo o llenar un formulario para que el seguro cubra el medicamento que se considera necesario.   Si se requiere una autorizacin previa para que su compaa de seguros cubra su medicamento, por favor permtanos de 1 a 2 das hbiles para completar este proceso.  Los precios de los medicamentos varan con frecuencia dependiendo del lugar de dnde se surte la receta y alguna farmacias pueden ofrecer precios ms baratos.  El sitio web www.goodrx.com tiene cupones para medicamentos de diferentes farmacias. Los precios aqu no tienen en cuenta lo que podra costar con la ayuda del seguro (puede ser ms barato con su seguro), pero el sitio web puede darle el precio si no utiliz ningn seguro.  - Puede imprimir el cupn correspondiente y llevarlo con su receta a la farmacia.  - Tambin puede pasar por nuestra oficina durante el horario de atencin regular y recoger una tarjeta de cupones de GoodRx.  -   Si necesita que su receta se enve electrnicamente a una farmacia diferente, informe a nuestra oficina a travs de MyChart de [place] o por telfono llamando al [phone] y presione la opcin 4.  

## 2022-01-04 ENCOUNTER — Encounter: Payer: Self-pay | Admitting: Dermatology

## 2022-02-07 DIAGNOSIS — E782 Mixed hyperlipidemia: Secondary | ICD-10-CM | POA: Diagnosis not present

## 2022-02-07 DIAGNOSIS — N390 Urinary tract infection, site not specified: Secondary | ICD-10-CM | POA: Diagnosis not present

## 2022-02-07 DIAGNOSIS — Z0000 Encounter for general adult medical examination without abnormal findings: Secondary | ICD-10-CM | POA: Diagnosis not present

## 2022-02-07 DIAGNOSIS — I10 Essential (primary) hypertension: Secondary | ICD-10-CM | POA: Diagnosis not present

## 2022-02-20 ENCOUNTER — Encounter: Payer: Self-pay | Admitting: Podiatry

## 2022-02-20 ENCOUNTER — Ambulatory Visit: Payer: Medicare HMO | Admitting: Podiatry

## 2022-02-20 DIAGNOSIS — L600 Ingrowing nail: Secondary | ICD-10-CM | POA: Diagnosis not present

## 2022-02-20 DIAGNOSIS — I10 Essential (primary) hypertension: Secondary | ICD-10-CM | POA: Insufficient documentation

## 2022-02-20 DIAGNOSIS — J300 Vasomotor rhinitis: Secondary | ICD-10-CM | POA: Insufficient documentation

## 2022-02-20 DIAGNOSIS — E785 Hyperlipidemia, unspecified: Secondary | ICD-10-CM | POA: Insufficient documentation

## 2022-02-20 DIAGNOSIS — J301 Allergic rhinitis due to pollen: Secondary | ICD-10-CM | POA: Insufficient documentation

## 2022-02-20 DIAGNOSIS — J309 Allergic rhinitis, unspecified: Secondary | ICD-10-CM | POA: Insufficient documentation

## 2022-02-20 DIAGNOSIS — L84 Corns and callosities: Secondary | ICD-10-CM

## 2022-02-20 NOTE — Progress Notes (Signed)
  Subjective:  Patient ID: [name], female    DOB: 1940/12/22,  MRN: [id number]  Chief Complaint  Patient presents with   Nail Problem   Callouses    81 y.o. female presents with the above complaint. History confirmed with patient.  She was at the skin is growing over the edge of the nail on the right big toe, she also has painful calluses on both feet she uses Dr. Felicie Morn occasionally and this helps  Objective:  Physical Exam: warm, good capillary refill, no trophic changes or ulcerative lesions, normal DP and PT pulses, normal sensory exam, and hypertrophic nail fold lateral border right hallux, no pain currently to palpation of the nail lateral border, she has hyperkeratotic porokeratosis bilateral fifth metatarsal base.  Assessment:   1. Callus of foot   2. Ingrowing right great toenail      Plan:  Patient was evaluated and treated and all questions answered.  We discussed the presence of the hypertrophic tissue and possibility of developing an ingrown nail.  Currently not particularly bothersome.  Discussed using a toe spacer to keep space here for the nail does not become ingrown.  She will follow-up as needed if the nail becomes more painful  Regarding the hyperkeratotic painful lesions we discussed etiology and treatment of these including offloading pads and use of cream such as salicylic acid.  I debrided the lesions as a courtesy today and applied the salicylic acid salinocaine ointment.  Advised to take off and wash in 6 to 8 hours with soap and water and she may use this intermittently.  She has difficulty reaching [place].  We discussed that routine regular debridement of this likely would not be covered and would be an out-of-pocket cost which we are happy to do.  She will return to see me as needed  Return if symptoms worsen or fail to improve.

## 2022-04-03 DIAGNOSIS — E782 Mixed hyperlipidemia: Secondary | ICD-10-CM | POA: Diagnosis not present

## 2022-04-03 DIAGNOSIS — I10 Essential (primary) hypertension: Secondary | ICD-10-CM | POA: Diagnosis not present

## 2022-04-03 DIAGNOSIS — R0602 Shortness of breath: Secondary | ICD-10-CM | POA: Diagnosis not present

## 2022-04-03 DIAGNOSIS — R002 Palpitations: Secondary | ICD-10-CM | POA: Diagnosis not present

## 2022-04-03 DIAGNOSIS — I340 Nonrheumatic mitral (valve) insufficiency: Secondary | ICD-10-CM | POA: Diagnosis not present

## 2022-04-05 DIAGNOSIS — M180 Bilateral primary osteoarthritis of first carpometacarpal joints: Secondary | ICD-10-CM | POA: Diagnosis not present

## 2022-05-23 ENCOUNTER — Other Ambulatory Visit: Payer: Self-pay | Admitting: Cardiovascular Disease

## 2022-05-23 DIAGNOSIS — I10 Essential (primary) hypertension: Secondary | ICD-10-CM

## 2022-06-08 ENCOUNTER — Other Ambulatory Visit: Payer: Self-pay | Admitting: Internal Medicine

## 2022-06-08 DIAGNOSIS — R000 Tachycardia, unspecified: Secondary | ICD-10-CM

## 2022-06-30 ENCOUNTER — Other Ambulatory Visit: Payer: Self-pay | Admitting: Internal Medicine

## 2022-06-30 ENCOUNTER — Other Ambulatory Visit: Payer: Self-pay | Admitting: Cardiovascular Disease

## 2022-06-30 DIAGNOSIS — I10 Essential (primary) hypertension: Secondary | ICD-10-CM

## 2022-07-04 DIAGNOSIS — J301 Allergic rhinitis due to pollen: Secondary | ICD-10-CM | POA: Diagnosis not present

## 2022-07-04 DIAGNOSIS — J300 Vasomotor rhinitis: Secondary | ICD-10-CM | POA: Diagnosis not present

## 2022-07-04 DIAGNOSIS — J3089 Other allergic rhinitis: Secondary | ICD-10-CM | POA: Diagnosis not present

## 2022-07-13 ENCOUNTER — Ambulatory Visit: Payer: Medicare HMO | Admitting: Cardiovascular Disease

## 2022-07-13 ENCOUNTER — Encounter: Payer: Self-pay | Admitting: Cardiovascular Disease

## 2022-07-13 VITALS — BP 122/70 | HR 77 | Ht 66.0 in | Wt 173.8 lb

## 2022-07-13 DIAGNOSIS — I340 Nonrheumatic mitral (valve) insufficiency: Secondary | ICD-10-CM

## 2022-07-13 DIAGNOSIS — E782 Mixed hyperlipidemia: Secondary | ICD-10-CM

## 2022-07-13 DIAGNOSIS — I10 Essential (primary) hypertension: Secondary | ICD-10-CM | POA: Diagnosis not present

## 2022-07-13 NOTE — Progress Notes (Signed)
Cardiology Office Note   Date:  07/13/2022   ID:  [name], DOB 1940-10-16, MRN [id number]  PCP:  Margaretann Loveless, MD  Cardiologist:  Adrian Blackwater, MD      History of Present Illness: [name] is a 82 y.o. female who presents for  Chief Complaint  Patient presents with   Follow-up    3 months    No symptoms      Past Medical History:  Diagnosis Date   Actinic keratosis 12/19/2018   Right lat. antecubital area. Hypertrophic.   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 Excised: 07/10/2017     Past Surgical History:  Procedure Laterality Date   COLONOSCOPY N/A 08/06/2014   Procedure: COLONOSCOPY;  Surgeon: Scot Jun, MD;  Location: [facility] ENDOSCOPY;  Service: Endoscopy;  Laterality: N/A;   IR NEPHROSTOMY PLACEMENT LEFT  09/24/2017   LAPAROSCOPIC NEPHRECTOMY, HAND ASSISTED Left 11/05/2017   Procedure: HAND ASSISTED LAPAROSCOPIC NEPHRECTOMY;  Surgeon: Vanna Scotland, MD;  Location: [facility] ORS;  Service: Urology;  Laterality: Left;   NEPHROSTOMY TUBE REMOVAL Left 11/05/2017   Procedure: NEPHROSTOMY TUBE REMOVAL;  Surgeon: Vanna Scotland, MD;  Location: [facility] ORS;  Service: Urology;  Laterality: Left;   SHOULDER ARTHROSCOPY Right 2014   TONSILLECTOMY     TUBAL LIGATION       Current Outpatient Medications  Medication Sig Dispense Refill   acetaminophen (TYLENOL) 500 MG tablet Take 1,000 mg by mouth every 6 (six) hours as needed (for pain.).     Azelastine HCl 137 MCG/SPRAY SOLN 1-2 puffs in each nostril Nasally Twice a day for 30 day(s)     chlorthalidone (HYGROTON) 25 MG tablet TAKE 1 TABLET BY MOUTH EVERY DAY 90 tablet 1   cholecalciferol (VITAMIN D) 1000 UNITS tablet Take 1,000 Units by mouth at bedtime.      metoprolol succinate (TOPROL-XL)  100 MG 24 hr tablet TAKE 1 TABLET BY MOUTH EVERY DAY 90 tablet 3   rosuvastatin (CRESTOR) 20 MG tablet TAKE 1 TABLET BY MOUTH EVERY DAY 90 tablet 1   valsartan (DIOVAN) 160 MG tablet TAKE 1 TABLET BY MOUTH EVERY DAY 90 tablet 0   No current facility-administered medications for this visit.    Allergies:   Adhesive [tape] and Raloxifene    Social History:   reports that she quit smoking about 47 years ago. Her smoking use included cigarettes. She has never used smokeless tobacco. She reports current alcohol use of about 7.0 standard drinks of alcohol per week. She reports that she does not use drugs.   Family History:  family history is not on file.    ROS:     Review of Systems  Constitutional: Negative.   HENT: Negative.    Eyes: Negative.   Respiratory: Negative.    Gastrointestinal: Negative.   Genitourinary: Negative.   Musculoskeletal: Negative.   Skin: Negative.   Neurological: Negative.   Endo/Heme/Allergies: Negative.   Psychiatric/Behavioral: Negative.    All other systems reviewed and are negative.     All other systems are reviewed and negative.    PHYSICAL EXAM: VS:  BP 122/70   Pulse 77   Ht  (1.676 m)   Wt 173 lb  12.8 oz (78.8 kg)   SpO2 96%   BMI 28.05 kg/m  , BMI Body mass index is 28.05 kg/m. Last weight:  Wt Readings from Last 3 Encounters:  07/13/22 173 lb 12.8 oz (78.8 kg)  12/04/17 146 lb (66.2 kg)  11/05/17 149 lb 7 oz (67.8 kg)     Physical Exam Constitutional:      Appearance: Normal appearance.  Cardiovascular:     Rate and Rhythm: Normal rate and regular rhythm.     Heart sounds: Normal heart sounds.  Pulmonary:     Effort: Pulmonary effort is normal.     Breath sounds: Normal breath sounds.  Musculoskeletal:     Right lower leg: No edema.     Left lower leg: No edema.  Neurological:     Mental Status: She is alert.       EKG:   Recent Labs: No results found for requested labs within last 365 days.    Lipid  Panel No results found for: "CHOL", "TRIG", "HDL", "CHOLHDL", "VLDL", "LDLCALC", "LDLDIRECT"    REASON FOR VISIT  Visit for: Echocardiogram/Palpitations  Sex:  female   wt= 179   lbs.  BP= 142/86  Height= 65   inches.     TESTS  Imaging: Echocardiogram:  An echocardiogram in (2-d) mode was performed and in Doppler mode with color flow velocity mapping was performed. Aortic valve flow velocity 13.5  m/s and systolic calculated mean flow gradient 4   mmHg. Mitral valve diastolic peak flow velocity E 5.6    m/s and E/A ratio .5. Aortic root diameter 3.4  cm. The LVOT internal diameter 2   cm and flow velocity was abnormal 10.8  m/s. LV systolic dimension 2.5  cm, diastolic 4.3  cm, posterior wall thickness 1 cm, fractional shortening 42 %, and EF 59 %. IVS thickness 1.3   cm. LA dimension 3.4 cm  RIGHT atrium=  11.2  cm2.     ASSESSMENT  Technically adequate study.  LV Normal size  Grade 1 LV diastolic dysfunction  RV Normal size and function  LA/RA normal size  Mild Aortic valve calcification with no regurgitation or stenosis  Mitral valve trace regurgitation  Tricuspid valve mild regurgitation  Pulmonic valve no stenosis/regurgitation  No pericardial effusion.     THERAPY   Referring physician: Laurier Nancy  Sonographer: Lenor Derrick.      Adrian Blackwater MD  Electronically signed by: Adrian Blackwater     Date: 06/15/2020 10:10 REASON FOR VISIT  Referred by Adrian Blackwater.        TESTS  Imaging: Computed Tomographic Angiography:  Cardiac multidetector CT was performed paying particular attention to the coronary arteries for the diagnosis of: Palpitations. Diagnostic Drugs:  Administered iohexol (Omnipaque) through an antecubital vein and images from the examination were analyzed for the presence and extent of coronary artery disease, using 3D image processing software. 100 mL of non-ionic contrast (Omnipaque) was used.        TEST CONCLUSIONS  1 - Calcium  score is 0.   2 - Right dominant system.  3 - Minor luminor irregularities in mid LAD with LCX and RCA normal.     Adrian Blackwater, MD  Electronically signed by: Adrian Blackwater     Date: 03/06/2014 13:51 Other studies Reviewed: Additional studies/ records that were reviewed today include:  Review of the above records demonstrates:       No data to display  ASSESSMENT AND PLAN:    ICD-10-CM   1. Primary hypertension  I10 PCV ECHOCARDIOGRAM COMPLETE   stable    2. Mixed hyperlipidemia  E78.2 PCV ECHOCARDIOGRAM COMPLETE    3. Nonrheumatic mitral valve regurgitation  I34.0 PCV ECHOCARDIOGRAM COMPLETE   mild       Problem List Items Addressed This Visit       Cardiovascular and Mediastinum   HTN (hypertension) - Primary   Relevant Orders   PCV ECHOCARDIOGRAM COMPLETE     Other   HLD (hyperlipidemia)   Relevant Orders   PCV ECHOCARDIOGRAM COMPLETE   Other Visit Diagnoses     Nonrheumatic mitral valve regurgitation       mild   Relevant Orders   PCV ECHOCARDIOGRAM COMPLETE          Disposition:   Return in about 3 months (around 10/12/2022) for echo in 3 months and then f/u.    Total time spent: 30 minutes  Signed,  Adrian Blackwater, MD  07/13/2022 11:28 AM    [facility]

## 2022-08-08 DIAGNOSIS — M1611 Unilateral primary osteoarthritis, right hip: Secondary | ICD-10-CM | POA: Diagnosis not present

## 2022-08-09 DIAGNOSIS — R32 Unspecified urinary incontinence: Secondary | ICD-10-CM | POA: Diagnosis not present

## 2022-08-09 DIAGNOSIS — Z6827 Body mass index (BMI) 27.0-27.9, adult: Secondary | ICD-10-CM | POA: Diagnosis not present

## 2022-08-09 DIAGNOSIS — I499 Cardiac arrhythmia, unspecified: Secondary | ICD-10-CM | POA: Diagnosis not present

## 2022-08-09 DIAGNOSIS — Z9181 History of falling: Secondary | ICD-10-CM | POA: Diagnosis not present

## 2022-08-09 DIAGNOSIS — R269 Unspecified abnormalities of gait and mobility: Secondary | ICD-10-CM | POA: Diagnosis not present

## 2022-08-09 DIAGNOSIS — Z87891 Personal history of nicotine dependence: Secondary | ICD-10-CM | POA: Diagnosis not present

## 2022-08-09 DIAGNOSIS — E785 Hyperlipidemia, unspecified: Secondary | ICD-10-CM | POA: Diagnosis not present

## 2022-08-09 DIAGNOSIS — J302 Other seasonal allergic rhinitis: Secondary | ICD-10-CM | POA: Diagnosis not present

## 2022-08-09 DIAGNOSIS — I10 Essential (primary) hypertension: Secondary | ICD-10-CM | POA: Diagnosis not present

## 2022-08-09 DIAGNOSIS — M1990 Unspecified osteoarthritis, unspecified site: Secondary | ICD-10-CM | POA: Diagnosis not present

## 2022-08-09 DIAGNOSIS — M8580 Other specified disorders of bone density and structure, unspecified site: Secondary | ICD-10-CM | POA: Diagnosis not present

## 2022-08-09 DIAGNOSIS — E669 Obesity, unspecified: Secondary | ICD-10-CM | POA: Diagnosis not present

## 2022-08-11 ENCOUNTER — Encounter: Payer: Self-pay | Admitting: Internal Medicine

## 2022-08-11 ENCOUNTER — Ambulatory Visit (INDEPENDENT_AMBULATORY_CARE_PROVIDER_SITE_OTHER): Payer: Medicare HMO | Admitting: Internal Medicine

## 2022-08-11 VITALS — BP 140/90 | HR 55 | Ht 66.0 in | Wt 175.2 lb

## 2022-08-11 DIAGNOSIS — E782 Mixed hyperlipidemia: Secondary | ICD-10-CM | POA: Diagnosis not present

## 2022-08-11 DIAGNOSIS — I10 Essential (primary) hypertension: Secondary | ICD-10-CM | POA: Diagnosis not present

## 2022-08-11 DIAGNOSIS — Z1231 Encounter for screening mammogram for malignant neoplasm of breast: Secondary | ICD-10-CM

## 2022-08-11 NOTE — Progress Notes (Signed)
Established Patient Office Visit  Subjective:  Patient ID: [name], female    DOB: 17-Jan-1941  Age: 82 y.o. MRN: [id number]  Chief Complaint  Patient presents with   Follow-up    6 month follow up    Patient comes in for her 50-month follow-up.  Her blood pressure is high today but patient reports that her right hip has been hurting so she went to the orthopedic urgent care where she was started on oral prednisone.  Today her face is also puffy because of the steroids and her blood pressure is high.  Although the pain is easing up somewhat.  She has a follow-up with the orthopedic for further management. Patient is due for blood work but will come back when she is off her steroids. She does not have any other complaints of headaches or dizziness, no chest pain, no palpitations, no shortness of breath. She is also due for mammogram, will schedule today.    No other concerns at this time.   Past Medical History:  Diagnosis Date   Actinic keratosis 12/19/2018   Right lat. antecubital area. Hypertrophic.   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 Excised: 07/10/2017    Past Surgical History:  Procedure Laterality Date   COLONOSCOPY N/A 08/06/2014   Procedure: COLONOSCOPY;  Surgeon: Scot Jun, MD;  Location: [facility] ENDOSCOPY;  Service: Endoscopy;  Laterality: N/A;   IR NEPHROSTOMY PLACEMENT LEFT  09/24/2017   LAPAROSCOPIC NEPHRECTOMY, HAND ASSISTED Left 11/05/2017   Procedure: HAND ASSISTED LAPAROSCOPIC NEPHRECTOMY;  Surgeon: Vanna Scotland, MD;  Location: [facility] ORS;  Service: Urology;  Laterality: Left;   NEPHROSTOMY TUBE REMOVAL Left 11/05/2017   Procedure: NEPHROSTOMY TUBE REMOVAL;  Surgeon: Vanna Scotland, MD;  Location: [facility] ORS;  Service: Urology;   Laterality: Left;   SHOULDER ARTHROSCOPY Right 2014   TONSILLECTOMY     TUBAL LIGATION      Social History   Socioeconomic History   Marital status: Married    Spouse name: Not on file   Number of children: Not on file   Years of education: Not on file   Highest education level: Not on file  Occupational History   Not on file  Tobacco Use   Smoking status: Former    Types: Cigarettes    Quit date: 03/28/1975    Years since quitting: 47.4   Smokeless tobacco: Never  Vaping Use   Vaping Use: Never used  Substance and Sexual Activity   Alcohol use: Yes    Alcohol/week: 7.0 standard drinks of alcohol    Types: 7 Glasses of wine per week   Drug use: No   Sexual activity: Not Currently  Other Topics Concern   Not on file  Social History Narrative   Not on file   Social Determinants of Health   Financial Resource Strain: Not on file  Food Insecurity: Not on file  Transportation Needs: Not on file  Physical Activity: Not on file  Stress: Not on file  Social Connections: Not on file  Intimate Partner Violence: Not on file    History reviewed. No pertinent family history.  Allergies  Allergen Reactions   Adhesive [Tape] Other (See Comments)    Skin blisters  if left on too long. Paper tape is okay   Raloxifene Other (See Comments)    (evista) Hot flashes    Review of Systems  Constitutional:  Positive for malaise/fatigue. Negative for chills, diaphoresis, fever and weight loss.  HENT: Negative.    Eyes: Negative.   Respiratory:  Negative for cough, shortness of breath and wheezing.   Cardiovascular:  Negative for chest pain, palpitations and leg swelling.  Gastrointestinal:  Negative for abdominal pain, constipation, diarrhea, heartburn, nausea and vomiting.  Genitourinary: Negative.   Musculoskeletal:  Positive for joint pain. Negative for back pain, falls, myalgias and neck pain.  Skin: Negative.   Neurological:  Negative for dizziness, tingling, tremors,  sensory change, speech change, focal weakness and headaches.  Psychiatric/Behavioral:  Negative for depression. The patient is not nervous/anxious and does not have insomnia.        Objective:   BP (!) 140/90   Pulse (!) 55   Ht 5\' 6"  (1.676 m)   Wt 175 lb 3.2 oz (79.5 kg)   SpO2 95%   BMI 28.28 kg/m   Vitals:   08/11/22 0857  BP: (!) 140/90  Pulse: (!) 55  Height: 5\' 6"  (1.676 m)  Weight: 175 lb 3.2 oz (79.5 kg)  SpO2: 95%  BMI (Calculated): 28.29    Physical Exam Vitals and nursing note reviewed.  Constitutional:      Appearance: Normal appearance.  Cardiovascular:     Rate and Rhythm: Normal rate and regular rhythm.     Pulses: Normal pulses.     Heart sounds: Normal heart sounds.  Pulmonary:     Effort: Pulmonary effort is normal.     Breath sounds: Normal breath sounds. No wheezing, rhonchi or rales.  Abdominal:     General: Abdomen is flat. There is no distension.     Palpations: Abdomen is soft. There is no mass.     Tenderness: There is no right CVA tenderness, left CVA tenderness or guarding.  Musculoskeletal:        General: No swelling, tenderness, deformity or signs of injury. Normal range of motion.     Cervical back: Normal range of motion and neck supple.     Right lower leg: No edema.     Left lower leg: No edema.  Skin:    General: Skin is warm and dry.  Neurological:     General: No focal deficit present.     Mental Status: She is alert and oriented to person, place, and time.  Psychiatric:        Mood and Affect: Mood normal.        Behavior: Behavior normal.      No results found for any visits on 08/11/22.  No results found for this or any previous visit (from the past 2160 hour(s)).    Assessment & Plan:  Patient will continue taking current medications.  Will return for fasting blood work.  Monitor blood pressure. Problem List Items Addressed This Visit     HLD (hyperlipidemia)   Essential hypertension, benign - Primary    Other Visit Diagnoses     Breast cancer screening by mammogram       Relevant Orders   MM 3D SCREENING MAMMOGRAM BILATERAL BREAST       Return in about 2 weeks (around 08/25/2022).   Total time spent: 25 minutes  Margaretann Loveless, MD  08/11/2022   This document may have been prepared by [facility] Voice Recognition software and as such may include  unintentional dictation errors.

## 2022-08-22 DIAGNOSIS — M1611 Unilateral primary osteoarthritis, right hip: Secondary | ICD-10-CM | POA: Diagnosis not present

## 2022-08-23 ENCOUNTER — Other Ambulatory Visit: Payer: Medicare HMO

## 2022-08-23 ENCOUNTER — Other Ambulatory Visit: Payer: Self-pay | Admitting: Family

## 2022-08-23 DIAGNOSIS — I10 Essential (primary) hypertension: Secondary | ICD-10-CM

## 2022-08-23 DIAGNOSIS — E782 Mixed hyperlipidemia: Secondary | ICD-10-CM

## 2022-08-23 DIAGNOSIS — M25551 Pain in right hip: Secondary | ICD-10-CM | POA: Diagnosis not present

## 2022-08-24 LAB — LIPID PANEL W/O CHOL/HDL RATIO
Cholesterol, Total: 156 mg/dL (ref 100–199)
HDL: 58 mg/dL (ref >39)
LDL Chol Calc (NIH): 61 mg/dL (ref 0–99)
Triglycerides: 233 mg/dL — ABNORMAL HIGH (ref 0–149)
VLDL Cholesterol Cal: 37 mg/dL (ref 5–40)

## 2022-08-24 LAB — CMP14+EGFR
ALT: 22 IU/L (ref 0–32)
AST: 19 IU/L (ref 0–40)
Albumin/Globulin Ratio: 1.8 (ref 1.2–2.2)
Albumin: 4.2 g/dL (ref 3.8–4.8)
Alkaline Phosphatase: 56 IU/L (ref 44–121)
BUN/Creatinine Ratio: 23 (ref 12–28)
BUN: 24 mg/dL (ref 8–27)
Bilirubin Total: 0.6 mg/dL (ref 0.0–1.2)
CO2: 24 mmol/L (ref 20–29)
Calcium: 9.1 mg/dL (ref 8.7–10.3)
Chloride: 102 mmol/L (ref 96–106)
Creatinine, Ser: 1.04 mg/dL — ABNORMAL HIGH (ref 0.57–1.00)
Globulin, Total: 2.4 g/dL (ref 1.5–4.5)
Glucose: 104 mg/dL — ABNORMAL HIGH (ref 70–99)
Potassium: 4.1 mmol/L (ref 3.5–5.2)
Sodium: 143 mmol/L (ref 134–144)
Total Protein: 6.6 g/dL (ref 6.0–8.5)
eGFR: 54 mL/min/{1.73_m2} — ABNORMAL LOW (ref >59)

## 2022-08-25 ENCOUNTER — Encounter: Payer: Self-pay | Admitting: Internal Medicine

## 2022-08-25 ENCOUNTER — Ambulatory Visit (INDEPENDENT_AMBULATORY_CARE_PROVIDER_SITE_OTHER): Payer: Medicare HMO | Admitting: Internal Medicine

## 2022-08-25 VITALS — BP 120/68 | HR 66 | Ht 66.0 in | Wt 173.0 lb

## 2022-08-25 DIAGNOSIS — E782 Mixed hyperlipidemia: Secondary | ICD-10-CM | POA: Diagnosis not present

## 2022-08-25 DIAGNOSIS — R7309 Other abnormal glucose: Secondary | ICD-10-CM | POA: Diagnosis not present

## 2022-08-25 DIAGNOSIS — I10 Essential (primary) hypertension: Secondary | ICD-10-CM

## 2022-08-25 NOTE — Progress Notes (Signed)
Established Patient Office Visit  Subjective:  Patient ID: [name], female    DOB: 1940/07/16  Age: 82 y.o. MRN: [id number]  Chief Complaint  Patient presents with   Follow-up    2 week follow up    Patient comes in for follow-up today.  She has completed her prednisone for her knee pain.  Her blood pressure is looking much better today.  As well as less puffiness of her face. Labs discussed, but triglycerides and glucose were above normal.  She needs to be careful with her diet. Waiting to get her mammogram scheduled for this year.    No other concerns at this time.   Past Medical History:  Diagnosis Date   Actinic keratosis 12/19/2018   Right lat. antecubital area. Hypertrophic.   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 Excised: 07/10/2017    Past Surgical History:  Procedure Laterality Date   COLONOSCOPY N/A 08/06/2014   Procedure: COLONOSCOPY;  Surgeon: Scot Jun, MD;  Location: [facility] ENDOSCOPY;  Service: Endoscopy;  Laterality: N/A;   IR NEPHROSTOMY PLACEMENT LEFT  09/24/2017   LAPAROSCOPIC NEPHRECTOMY, HAND ASSISTED Left 11/05/2017   Procedure: HAND ASSISTED LAPAROSCOPIC NEPHRECTOMY;  Surgeon: Vanna Scotland, MD;  Location: [facility] ORS;  Service: Urology;  Laterality: Left;   NEPHROSTOMY TUBE REMOVAL Left 11/05/2017   Procedure: NEPHROSTOMY TUBE REMOVAL;  Surgeon: Vanna Scotland, MD;  Location: [facility] ORS;  Service: Urology;  Laterality: Left;   SHOULDER ARTHROSCOPY Right 2014   TONSILLECTOMY     TUBAL LIGATION      Social History   Socioeconomic History   Marital status: Married    Spouse name: Not on file   Number of children: Not on file   Years of education: Not on file   Highest education level: Not on file  Occupational History    Not on file  Tobacco Use   Smoking status: Former    Types: Cigarettes    Quit date: 03/28/1975    Years since quitting: 47.4   Smokeless tobacco: Never  Vaping Use   Vaping Use: Never used  Substance and Sexual Activity   Alcohol use: Yes    Alcohol/week: 7.0 standard drinks of alcohol    Types: 7 Glasses of wine per week   Drug use: No   Sexual activity: Not Currently  Other Topics Concern   Not on file  Social History Narrative   Not on file   Social Determinants of Health   Financial Resource Strain: Not on file  Food Insecurity: Not on file  Transportation Needs: Not on file  Physical Activity: Not on file  Stress: Not on file  Social Connections: Not on file  Intimate Partner Violence: Not on file    History reviewed. No pertinent family history.  Allergies  Allergen Reactions   Amlodipine Swelling   Adhesive [Tape] Other (See Comments)    Skin blisters if left on too long. Paper tape is okay   Raloxifene Other (See Comments)    (evista) Hot flashes    Review of Systems  Constitutional:  Negative for chills, fever, malaise/fatigue and weight loss.  HENT: Negative.    Eyes: Negative.   Respiratory:  Negative for cough, shortness of  breath and wheezing.   Cardiovascular:  Negative for chest pain, leg swelling and PND.  Gastrointestinal:  Negative for abdominal pain, blood in stool, heartburn, melena and nausea.  Genitourinary: Negative.  Negative for dysuria, flank pain, frequency, hematuria and urgency.  Musculoskeletal:  Positive for joint pain. Negative for back pain, falls, myalgias and neck pain.  Neurological:  Negative for dizziness, focal weakness, weakness and headaches.  Psychiatric/Behavioral:  Negative for depression. The patient is not nervous/anxious and does not have insomnia.        Objective:   BP 120/68   Pulse 66   Ht 5\' 6"  (1.676 m)   Wt 173 lb (78.5 kg)   SpO2 96%   BMI 27.92 kg/m   Vitals:   08/25/22 0946  BP: 120/68   Pulse: 66  Height: 5\' 6"  (1.676 m)  Weight: 173 lb (78.5 kg)  SpO2: 96%  BMI (Calculated): 27.94    Physical Exam Vitals and nursing note reviewed.  Constitutional:      General: She is not in acute distress.    Appearance: Normal appearance.  HENT:     Head: Normocephalic and atraumatic.  Cardiovascular:     Rate and Rhythm: Normal rate and regular rhythm.     Pulses: Normal pulses.     Heart sounds: Normal heart sounds. No murmur heard.    No gallop.  Pulmonary:     Effort: Pulmonary effort is normal.     Breath sounds: Normal breath sounds. No wheezing or rales.  Abdominal:     General: Abdomen is flat. There is no distension.     Palpations: Abdomen is soft. There is no mass.     Tenderness: There is no right CVA tenderness, left CVA tenderness, guarding or rebound.  Musculoskeletal:        General: No deformity. Normal range of motion.     Cervical back: Normal range of motion and neck supple.     Right lower leg: No edema.     Left lower leg: No edema.  Skin:    General: Skin is warm and dry.  Neurological:     General: No focal deficit present.     Mental Status: She is alert.      No results found for any visits on 08/25/22.  Recent Results (from the past 2160 hour(s))  CMP14+EGFR     Status: Abnormal   Collection Time: 08/23/22 10:01 AM  Result Value Ref Range   Glucose 104 (H) 70 - 99 mg/dL   BUN 24 8 - 27 mg/dL   Creatinine, Ser 1.61 (H) 0.57 - 1.00 mg/dL   eGFR 54 (L) >09 UE/AVW/0.98   BUN/Creatinine Ratio 23 12 - 28   Sodium 143 134 - 144 mmol/L   Potassium 4.1 3.5 - 5.2 mmol/L   Chloride 102 96 - 106 mmol/L   CO2 24 20 - 29 mmol/L   Calcium 9.1 8.7 - 10.3 mg/dL   Total Protein 6.6 6.0 - 8.5 g/dL   Albumin 4.2 3.8 - 4.8 g/dL   Globulin, Total 2.4 1.5 - 4.5 g/dL   Albumin/Globulin Ratio 1.8 1.2 - 2.2   Bilirubin Total 0.6 0.0 - 1.2 mg/dL   Alkaline Phosphatase 56 44 - 121 IU/L   AST 19 0 - 40 IU/L   ALT 22 0 - 32 IU/L  Lipid Panel w/o  Chol/HDL Ratio     Status: Abnormal   Collection Time: 08/23/22 10:01 AM  Result Value Ref Range   Cholesterol, Total  156 100 - 199 mg/dL   Triglycerides 098 (H) 0 - 149 mg/dL   HDL 58 >11 mg/dL   VLDL Cholesterol Cal 37 5 - 40 mg/dL   LDL Chol Calc (NIH) 61 0 - 99 mg/dL      Assessment & Plan:  Patient will continue taking her medications as such.  Needs strict diet control for fried fatty food as well as current to avoid concentrated sweets. Problem List Items Addressed This Visit     HLD (hyperlipidemia)   Essential hypertension, benign - Primary   Elevated glucose    Return in about 4 months (around 12/25/2022).   Total time spent: 25 minutes  Margaretann Loveless, MD  08/25/2022   This document may have been prepared by [facility] Voice Recognition software and as such may include unintentional dictation errors.

## 2022-09-18 DIAGNOSIS — M25551 Pain in right hip: Secondary | ICD-10-CM | POA: Diagnosis not present

## 2022-09-18 DIAGNOSIS — H5213 Myopia, bilateral: Secondary | ICD-10-CM | POA: Diagnosis not present

## 2022-09-25 DIAGNOSIS — M25551 Pain in right hip: Secondary | ICD-10-CM | POA: Diagnosis not present

## 2022-09-25 DIAGNOSIS — M25661 Stiffness of right knee, not elsewhere classified: Secondary | ICD-10-CM | POA: Diagnosis not present

## 2022-10-03 DIAGNOSIS — M25551 Pain in right hip: Secondary | ICD-10-CM | POA: Diagnosis not present

## 2022-10-03 DIAGNOSIS — M25661 Stiffness of right knee, not elsewhere classified: Secondary | ICD-10-CM | POA: Diagnosis not present

## 2022-10-09 DIAGNOSIS — M25551 Pain in right hip: Secondary | ICD-10-CM | POA: Diagnosis not present

## 2022-10-09 DIAGNOSIS — M25661 Stiffness of right knee, not elsewhere classified: Secondary | ICD-10-CM | POA: Diagnosis not present

## 2022-10-10 ENCOUNTER — Encounter: Payer: Self-pay | Admitting: Internal Medicine

## 2022-10-10 DIAGNOSIS — Z1231 Encounter for screening mammogram for malignant neoplasm of breast: Secondary | ICD-10-CM | POA: Diagnosis not present

## 2022-10-12 ENCOUNTER — Other Ambulatory Visit: Payer: Medicare HMO

## 2022-10-19 ENCOUNTER — Ambulatory Visit: Payer: Medicare HMO | Admitting: Cardiovascular Disease

## 2022-10-25 ENCOUNTER — Ambulatory Visit (INDEPENDENT_AMBULATORY_CARE_PROVIDER_SITE_OTHER): Payer: Medicare HMO

## 2022-10-25 DIAGNOSIS — I340 Nonrheumatic mitral (valve) insufficiency: Secondary | ICD-10-CM

## 2022-10-25 DIAGNOSIS — E782 Mixed hyperlipidemia: Secondary | ICD-10-CM

## 2022-10-25 DIAGNOSIS — I351 Nonrheumatic aortic (valve) insufficiency: Secondary | ICD-10-CM | POA: Diagnosis not present

## 2022-10-25 DIAGNOSIS — I361 Nonrheumatic tricuspid (valve) insufficiency: Secondary | ICD-10-CM

## 2022-10-25 DIAGNOSIS — I10 Essential (primary) hypertension: Secondary | ICD-10-CM

## 2022-10-30 ENCOUNTER — Other Ambulatory Visit: Payer: Medicare HMO

## 2022-11-02 ENCOUNTER — Ambulatory Visit: Payer: Medicare HMO | Admitting: Cardiovascular Disease

## 2022-11-06 ENCOUNTER — Ambulatory Visit: Payer: Medicare HMO | Admitting: Cardiovascular Disease

## 2022-11-06 ENCOUNTER — Encounter: Payer: Self-pay | Admitting: Cardiovascular Disease

## 2022-11-06 VITALS — BP 135/80 | HR 78 | Ht 66.0 in | Wt 178.0 lb

## 2022-11-06 DIAGNOSIS — R0602 Shortness of breath: Secondary | ICD-10-CM | POA: Diagnosis not present

## 2022-11-06 DIAGNOSIS — E782 Mixed hyperlipidemia: Secondary | ICD-10-CM

## 2022-11-06 DIAGNOSIS — I10 Essential (primary) hypertension: Secondary | ICD-10-CM | POA: Diagnosis not present

## 2022-11-06 DIAGNOSIS — I5032 Chronic diastolic (congestive) heart failure: Secondary | ICD-10-CM

## 2022-11-06 MED ORDER — DAPAGLIFLOZIN PROPANEDIOL 10 MG PO TABS: 10.0000 mg | ORAL_TABLET | Freq: Every day | ORAL | 3 refills | Status: DC

## 2022-11-06 NOTE — Progress Notes (Signed)
Cardiology Office Note   Date:  11/06/2022   ID:  [name], DOB 12/12/1940, MRN [id number]  PCP:  Margaretann Loveless, MD  Cardiologist:  Adrian Blackwater, MD      History of Present Illness: [name] is a 82 y.o. female who presents for  Chief Complaint  Patient presents with   Follow-up    ECHO results    Shortness of Breath This is a chronic problem. The current episode started more than 1 month ago. The problem has been unchanged.      Past Medical History:  Diagnosis Date   Actinic keratosis 12/19/2018   Right lat. antecubital area. Hypertrophic.   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 Excised: 07/10/2017     Past Surgical History:  Procedure Laterality Date   COLONOSCOPY N/A 08/06/2014   Procedure: COLONOSCOPY;  Surgeon: Scot Jun, MD;  Location: [facility] ENDOSCOPY;  Service: Endoscopy;  Laterality: N/A;   IR NEPHROSTOMY PLACEMENT LEFT  09/24/2017   LAPAROSCOPIC NEPHRECTOMY, HAND ASSISTED Left 11/05/2017   Procedure: HAND ASSISTED LAPAROSCOPIC NEPHRECTOMY;  Surgeon: Vanna Scotland, MD;  Location: [facility] ORS;  Service: Urology;  Laterality: Left;   NEPHROSTOMY TUBE REMOVAL Left 11/05/2017   Procedure: NEPHROSTOMY TUBE REMOVAL;  Surgeon: Vanna Scotland, MD;  Location: [facility] ORS;  Service: Urology;  Laterality: Left;   SHOULDER ARTHROSCOPY Right 2014   TONSILLECTOMY     TUBAL LIGATION       Current Outpatient Medications  Medication Sig Dispense Refill   acetaminophen (TYLENOL) 500 MG tablet Take 1,000 mg by mouth every 6 (six) hours as needed (for pain.).     Azelastine HCl 137 MCG/SPRAY SOLN 1-2 puffs in each nostril Nasally Twice a day for 30 day(s)     chlorthalidone (HYGROTON) 25 MG tablet TAKE 1 TABLET BY MOUTH EVERY DAY 90 tablet 1    cholecalciferol (VITAMIN D) 1000 UNITS tablet Take 1,000 Units by mouth at bedtime.      dapagliflozin propanediol (FARXIGA) 10 MG TABS tablet Take 1 tablet (10 mg total) by mouth daily before breakfast. 30 tablet 3   metoprolol succinate (TOPROL-XL) 100 MG 24 hr tablet TAKE 1 TABLET BY MOUTH EVERY DAY 90 tablet 3   rosuvastatin (CRESTOR) 20 MG tablet TAKE 1 TABLET BY MOUTH EVERY DAY 90 tablet 1   valsartan (DIOVAN) 160 MG tablet TAKE 1 TABLET BY MOUTH EVERY DAY 90 tablet 0   No current facility-administered medications for this visit.    Allergies:   Amlodipine, Adhesive [tape], and Raloxifene    Social History:   reports that she quit smoking about 47 years ago. Her smoking use included cigarettes. She has never used smokeless tobacco. She reports current alcohol use of about 7.0 standard drinks of alcohol per week. She reports that she does not use drugs.   Family History:  family history is not on file.    ROS:     Review of Systems  Constitutional: Negative.   HENT: Negative.    Eyes: Negative.   Respiratory:  Positive for shortness of breath.   Gastrointestinal: Negative.   Genitourinary: Negative.   Musculoskeletal: Negative.   Skin: Negative.   Neurological: Negative.   Endo/Heme/Allergies: Negative.  Psychiatric/Behavioral: Negative.    All other systems reviewed and are negative.     All other systems are reviewed and negative.    PHYSICAL EXAM: VS:  BP 135/80   Pulse 78   Ht 5\' 6"  (1.676 m)   Wt 178 lb (80.7 kg)   SpO2 94%   BMI 28.73 kg/m  , BMI Body mass index is 28.73 kg/m. Last weight:  Wt Readings from Last 3 Encounters:  11/06/22 178 lb (80.7 kg)  08/25/22 173 lb (78.5 kg)  08/11/22 175 lb 3.2 oz (79.5 kg)     Physical Exam Constitutional:      Appearance: Normal appearance.  Cardiovascular:     Rate and Rhythm: Normal rate and regular rhythm.     Heart sounds: Normal heart sounds.  Pulmonary:     Effort: Pulmonary effort is normal.      Breath sounds: Normal breath sounds.  Musculoskeletal:     Right lower leg: No edema.     Left lower leg: No edema.  Neurological:     Mental Status: She is alert.       EKG:   Recent Labs: 08/23/2022: ALT 22; BUN 24; Creatinine, Ser 1.04; Potassium 4.1; Sodium 143    Lipid Panel    Component Value Date/Time   CHOL 156 08/23/2022 1001   TRIG 233 (H) 08/23/2022 1001   HDL 58 08/23/2022 1001   LDLCALC 61 08/23/2022 1001      Other studies Reviewed: Additional studies/ records that were reviewed today include:  Review of the above records demonstrates:       No data to display            ASSESSMENT AND PLAN:    ICD-10-CM   1. Essential hypertension, benign  I10 dapagliflozin propanediol (FARXIGA) 10 MG TABS tablet    2. Mixed hyperlipidemia  E78.2 dapagliflozin propanediol (FARXIGA) 10 MG TABS tablet    3. Chronic heart failure with preserved ejection fraction (HCC)  I50.32 dapagliflozin propanediol (FARXIGA) 10 MG TABS tablet   has grade 1 diastolic dysfunction, place on farxiga    4. SOB (shortness of breath)  R06.02        Problem List Items Addressed This Visit       Cardiovascular and Mediastinum   Essential hypertension, benign - Primary   Relevant Medications   dapagliflozin propanediol (FARXIGA) 10 MG TABS tablet     Other   HLD (hyperlipidemia)   Relevant Medications   dapagliflozin propanediol (FARXIGA) 10 MG TABS tablet   Other Visit Diagnoses     Chronic heart failure with preserved ejection fraction (HCC)       has grade 1 diastolic dysfunction, place on farxiga   Relevant Medications   dapagliflozin propanediol (FARXIGA) 10 MG TABS tablet   SOB (shortness of breath)              Disposition:   Return in about 4 weeks (around 12/04/2022).    Total time spent: 40 minutes  Signed,  Adrian Blackwater, MD  11/06/2022 10:38 AM    [facility]

## 2022-11-08 ENCOUNTER — Ambulatory Visit: Payer: Medicare HMO | Admitting: Dermatology

## 2022-11-08 VITALS — BP 123/67

## 2022-11-08 DIAGNOSIS — W908XXA Exposure to other nonionizing radiation, initial encounter: Secondary | ICD-10-CM | POA: Diagnosis not present

## 2022-11-08 DIAGNOSIS — D239 Other benign neoplasm of skin, unspecified: Secondary | ICD-10-CM

## 2022-11-08 DIAGNOSIS — L820 Inflamed seborrheic keratosis: Secondary | ICD-10-CM | POA: Diagnosis not present

## 2022-11-08 DIAGNOSIS — L578 Other skin changes due to chronic exposure to nonionizing radiation: Secondary | ICD-10-CM

## 2022-11-08 DIAGNOSIS — D2371 Other benign neoplasm of skin of right lower limb, including hip: Secondary | ICD-10-CM | POA: Diagnosis not present

## 2022-11-08 DIAGNOSIS — L821 Other seborrheic keratosis: Secondary | ICD-10-CM

## 2022-11-08 DIAGNOSIS — L814 Other melanin hyperpigmentation: Secondary | ICD-10-CM | POA: Diagnosis not present

## 2022-11-08 NOTE — Patient Instructions (Addendum)

## 2022-11-08 NOTE — Progress Notes (Signed)
   Follow-Up Visit   Subjective  [name] is a 82 y.o. female who presents for the following: check mole L thigh, ~56m, no symptoms, check spot R pretibial, 30 yrs, no symptoms The patient has spots, moles and lesions to be evaluated, some may be new or changing and the patient may have concern these could be cancer.   The following portions of the chart were reviewed this encounter and updated as appropriate: medications, allergies, medical history  Review of Systems:  No other skin or systemic complaints except as noted in HPI or Assessment and Plan.  Objective  Well appearing patient in no apparent distress; mood and affect are within normal limits.   A focused examination was performed of the following areas: legs  Relevant exam findings are noted in the Assessment and Plan.  L lat thigh x 1 Stuck on waxy paps with erythema    Assessment & Plan     Inflamed seborrheic keratosis L lat thigh x 1  Symptomatic, irritating, patient would like treated.  Destruction of lesion - L lat thigh x 1 Complexity: simple   Destruction method: cryotherapy   Informed consent: discussed and consent obtained   Timeout:  patient name, date of birth, surgical site, and procedure verified Lesion destroyed using liquid nitrogen: Yes   Region frozen until ice ball extended beyond lesion: Yes   Outcome: patient tolerated procedure well with no complications   Post-procedure details: wound care instructions given     DERMATOFIBROMA Exam: firm dimpling flesh colored pap R med lower leg below the knee Treatment Plan: A dermatofibroma is a benign growth possibly related to trauma, such as an insect bite, cut from shaving, or inflamed acne-type bump.  Treatment options to remove include shave or excision with resulting scar and risk of recurrence.  Since benign-appearing and not bothersome, will observe for now.    SEBORRHEIC KERATOSIS - Stuck-on, waxy, tan-brown papules and/or plaques   - Benign-appearing - Discussed benign etiology and prognosis. - Observe - Call for any changes  LENTIGINES Exam: scattered tan macules Due to sun exposure Treatment Plan: Benign-appearing, observe. Recommend daily broad spectrum sunscreen SPF 30+ to sun-exposed areas, reapply every 2 hours as needed.  Call for any changes  ACTINIC DAMAGE - chronic, secondary to cumulative UV radiation exposure/sun exposure over time - diffuse scaly erythematous macules with underlying dyspigmentation - Recommend daily broad spectrum sunscreen SPF 30+ to sun-exposed areas, reapply every 2 hours as needed.  - Recommend staying in the shade or wearing long sleeves, sun glasses (UVA+UVB protection) and wide brim hats (4-inch brim around the entire circumference of the hat). - Call for new or changing lesions.   Return for as scheduled for TBSE, Hx of SCC, Hx of AKs.  I, Ardis Rowan, RMA, am acting as scribe for Armida Sans, MD .   Documentation: I have reviewed the above documentation for accuracy and completeness, and I agree with the above.  Armida Sans, MD

## 2022-11-10 ENCOUNTER — Other Ambulatory Visit: Payer: Self-pay | Admitting: Internal Medicine

## 2022-11-10 DIAGNOSIS — I10 Essential (primary) hypertension: Secondary | ICD-10-CM

## 2022-11-17 ENCOUNTER — Other Ambulatory Visit: Payer: Self-pay | Admitting: Family

## 2022-11-17 DIAGNOSIS — I10 Essential (primary) hypertension: Secondary | ICD-10-CM

## 2022-11-19 ENCOUNTER — Encounter: Payer: Self-pay | Admitting: Dermatology

## 2022-11-21 ENCOUNTER — Ambulatory Visit (INDEPENDENT_AMBULATORY_CARE_PROVIDER_SITE_OTHER): Payer: Medicare HMO | Admitting: Internal Medicine

## 2022-11-21 ENCOUNTER — Encounter: Payer: Self-pay | Admitting: Internal Medicine

## 2022-11-21 VITALS — BP 120/86 | HR 78 | Temp 98.6°F | Ht 66.0 in | Wt 175.2 lb

## 2022-11-21 DIAGNOSIS — U071 COVID-19: Secondary | ICD-10-CM

## 2022-11-21 DIAGNOSIS — R051 Acute cough: Secondary | ICD-10-CM | POA: Diagnosis not present

## 2022-11-21 LAB — POCT XPERT XPRESS SARS COVID-2/FLU/RSV
FLU A: NEGATIVE
FLU B: NEGATIVE
RSV RNA, PCR: NEGATIVE
SARS Coronavirus 2: POSITIVE

## 2022-11-21 MED ORDER — PAXLOVID (300/100) 20 X 150 MG & 10 X 100MG PO TBPK: ORAL_TABLET | ORAL | 0 refills | Status: DC

## 2022-11-21 MED ORDER — AZITHROMYCIN 250 MG PO TABS: ORAL_TABLET | ORAL | 0 refills | Status: AC

## 2022-11-21 NOTE — Progress Notes (Signed)
Patient notified

## 2022-11-21 NOTE — Progress Notes (Signed)
Established Patient Office Visit  Subjective:  Patient ID: [name], female    DOB: [birth year]  Age: 82 y.o. MRN: [id number]  Chief Complaint  Patient presents with   Acute Visit    Fever, runny nose, cough, congestion.    Patient comes in with 2-day history of feeling very tired, fevers chills, headaches head and sinus congestion and cough. There is clear discharge from the nose and sputum is also clear. Mild nausea but no vomiting, no diarrhea and no constipation.  COVID test is positive in the office. Will send in prescription for Paxlovid    No other concerns at this time.   Past Medical History:  Diagnosis Date   Actinic keratosis 12/19/2018   Right lat. antecubital area. Hypertrophic.   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 Excised: 07/10/2017    Past Surgical History:  Procedure Laterality Date   COLONOSCOPY N/A 08/06/2014   Procedure: COLONOSCOPY;  Surgeon: Scot Jun, MD;  Location: [facility] ENDOSCOPY;  Service: Endoscopy;  Laterality: N/A;   IR NEPHROSTOMY PLACEMENT LEFT  09/24/2017   LAPAROSCOPIC NEPHRECTOMY, HAND ASSISTED Left 11/05/2017   Procedure: HAND ASSISTED LAPAROSCOPIC NEPHRECTOMY;  Surgeon: Vanna Scotland, MD;  Location: [facility] ORS;  Service: Urology;  Laterality: Left;   NEPHROSTOMY TUBE REMOVAL Left 11/05/2017   Procedure: NEPHROSTOMY TUBE REMOVAL;  Surgeon: Vanna Scotland, MD;  Location: [facility] ORS;  Service: Urology;  Laterality: Left;   SHOULDER ARTHROSCOPY Right 2014   TONSILLECTOMY     TUBAL LIGATION      Social History   Socioeconomic History   Marital status: Married    Spouse name: Not on file   Number of children: Not on file   Years of education: Not on file   Highest education level: Not on file  Occupational  History   Not on file  Tobacco Use   Smoking status: Former    Current packs/day: 0.00    Types: Cigarettes    Quit date: 03/28/1975    Years since quitting: 47.6   Smokeless tobacco: Never  Vaping Use   Vaping status: Never Used  Substance and Sexual Activity   Alcohol use: Yes    Alcohol/week: 7.0 standard drinks of alcohol    Types: 7 Glasses of wine per week   Drug use: No   Sexual activity: Not Currently  Other Topics Concern   Not on file  Social History Narrative   Not on file   Social Determinants of Health   Financial Resource Strain: Not on file  Food Insecurity: Not on file  Transportation Needs: Not on file  Physical Activity: Not on file  Stress: Not on file  Social Connections: Not on file  Intimate Partner Violence: Not on file    History reviewed. No pertinent family history.  Allergies  Allergen Reactions   Amlodipine Swelling   Adhesive [Tape] Other (See Comments)    Skin blisters if left on too long. Paper tape is okay   Raloxifene Other (See Comments)    (evista) Hot flashes    Review of Systems  Constitutional:  Positive for chills, fever and malaise/fatigue. Negative for diaphoresis.  HENT:  Positive for congestion and sinus pain. Negative for sore throat.  Eyes: Negative.   Respiratory:  Positive for cough and sputum production. Negative for shortness of breath and wheezing.   Cardiovascular: Negative.  Negative for chest pain, palpitations and leg swelling.  Gastrointestinal:  Positive for nausea. Negative for abdominal pain, constipation, diarrhea, heartburn and vomiting.  Genitourinary: Negative.  Negative for dysuria and flank pain.  Musculoskeletal: Negative.  Negative for joint pain and myalgias.  Skin: Negative.   Neurological: Negative.  Negative for dizziness and headaches.  Endo/Heme/Allergies: Negative.   Psychiatric/Behavioral: Negative.  Negative for depression and suicidal ideas. The patient is not nervous/anxious.         Objective:   BP 120/86   Pulse 78   Temp 98.6 F (37 C) (Tympanic)   Ht 5\' 6"  (1.676 m)   Wt 175 lb 3.2 oz (79.5 kg)   SpO2 (!) 89%   BMI 28.28 kg/m   Vitals:   11/21/22 1129  BP: 120/86  Pulse: 78  Temp: 98.6 F (37 C)  Height: 5\' 6"  (1.676 m)  Weight: 175 lb 3.2 oz (79.5 kg)  SpO2: (!) 89%  TempSrc: Tympanic  BMI (Calculated): 28.29    Physical Exam Vitals and nursing note reviewed.  Constitutional:      Appearance: Normal appearance.  HENT:     Head: Normocephalic and atraumatic.     Nose: Nose normal.     Mouth/Throat:     Mouth: Mucous membranes are moist.     Pharynx: Oropharynx is clear.  Eyes:     Conjunctiva/sclera: Conjunctivae normal.     Pupils: Pupils are equal, round, and reactive to light.  Cardiovascular:     Rate and Rhythm: Normal rate and regular rhythm.     Pulses: Normal pulses.     Heart sounds: Normal heart sounds. No murmur heard. Pulmonary:     Effort: Pulmonary effort is normal.     Breath sounds: Normal breath sounds. No wheezing, rhonchi or rales.  Chest:     Chest wall: No tenderness.  Abdominal:     General: Bowel sounds are normal.     Palpations: Abdomen is soft.     Tenderness: There is no abdominal tenderness. There is no right CVA tenderness or left CVA tenderness.  Musculoskeletal:        General: Normal range of motion.     Cervical back: Normal range of motion.     Right lower leg: No edema.     Left lower leg: No edema.  Skin:    General: Skin is warm and dry.  Neurological:     General: No focal deficit present.     Mental Status: She is alert and oriented to person, place, and time.  Psychiatric:        Mood and Affect: Mood normal.        Behavior: Behavior normal.      Results for orders placed or performed in visit on 11/21/22  POCT XPERT XPRESS SARS COVID-2/FLU/RSV  Result Value Ref Range   SARS Coronavirus 2 pos    FLU A neg    FLU B neg    RSV RNA, PCR neg     Recent Results (from the past  2160 hour(s))  POCT XPERT XPRESS SARS COVID-2/FLU/RSV     Status: None   Collection Time: 11/21/22 12:53 PM  Result Value Ref Range   SARS Coronavirus 2 pos    FLU A neg    FLU B neg    RSV RNA, PCR neg  Assessment & Plan:  Start Paxlovid.  Hold Crestor while on it. Will also send in a prescription for Z-Pak. Problem List Items Addressed This Visit   None Visit Diagnoses     COVID-19 virus infection    -  Primary   Relevant Medications   azithromycin (ZITHROMAX Z-PAK) 250 MG tablet   nirmatrelvir & ritonavir (PAXLOVID, 300/100,) 20 x 150 MG & 10 x 100MG  TBPK   Acute cough       Relevant Medications   azithromycin (ZITHROMAX Z-PAK) 250 MG tablet   Other Relevant Orders   POCT XPERT XPRESS SARS COVID-2/FLU/RSV (Completed)       Follow up as scheduled.  Total time spent: 25 minutes  Margaretann Loveless, MD  11/21/2022   This document may have been prepared by [facility] Voice Recognition software and as such may include unintentional dictation errors.

## 2022-12-07 ENCOUNTER — Ambulatory Visit (INDEPENDENT_AMBULATORY_CARE_PROVIDER_SITE_OTHER): Payer: Medicare HMO | Admitting: Cardiovascular Disease

## 2022-12-07 ENCOUNTER — Encounter: Payer: Self-pay | Admitting: Cardiovascular Disease

## 2022-12-07 VITALS — BP 127/65 | HR 56 | Ht 66.0 in | Wt 175.2 lb

## 2022-12-07 DIAGNOSIS — I5032 Chronic diastolic (congestive) heart failure: Secondary | ICD-10-CM | POA: Diagnosis not present

## 2022-12-07 DIAGNOSIS — I10 Essential (primary) hypertension: Secondary | ICD-10-CM | POA: Diagnosis not present

## 2022-12-07 DIAGNOSIS — R0602 Shortness of breath: Secondary | ICD-10-CM

## 2022-12-07 DIAGNOSIS — E782 Mixed hyperlipidemia: Secondary | ICD-10-CM

## 2022-12-07 NOTE — Progress Notes (Signed)
Cardiology Office Note   Date:  12/07/2022   ID:  [name], DOB Aug 26, 1940, MRN [id number]  PCP:  Margaretann Loveless, MD  Cardiologist:  Adrian Blackwater, MD      History of Present Illness: [name] is a 82 y.o. female who presents for  Chief Complaint  Patient presents with   Follow-up    SOB better with Marcelline Deist      Past Medical History:  Diagnosis Date   Actinic keratosis 12/19/2018   Right lat. antecubital area. Hypertrophic.   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 Excised: 07/10/2017     Past Surgical History:  Procedure Laterality Date   COLONOSCOPY N/A 08/06/2014   Procedure: COLONOSCOPY;  Surgeon: Scot Jun, MD;  Location: [facility] ENDOSCOPY;  Service: Endoscopy;  Laterality: N/A;   IR NEPHROSTOMY PLACEMENT LEFT  09/24/2017   LAPAROSCOPIC NEPHRECTOMY, HAND ASSISTED Left 11/05/2017   Procedure: HAND ASSISTED LAPAROSCOPIC NEPHRECTOMY;  Surgeon: Vanna Scotland, MD;  Location: [facility] ORS;  Service: Urology;  Laterality: Left;   NEPHROSTOMY TUBE REMOVAL Left 11/05/2017   Procedure: NEPHROSTOMY TUBE REMOVAL;  Surgeon: Vanna Scotland, MD;  Location: [facility] ORS;  Service: Urology;  Laterality: Left;   SHOULDER ARTHROSCOPY Right 2014   TONSILLECTOMY     TUBAL LIGATION       Current Outpatient Medications  Medication Sig Dispense Refill   acetaminophen (TYLENOL) 500 MG tablet Take 1,000 mg by mouth every 6 (six) hours as needed (for pain.).     Azelastine HCl 137 MCG/SPRAY SOLN 1-2 puffs in each nostril Nasally Twice a day for 30 day(s)     chlorthalidone (HYGROTON) 25 MG tablet TAKE 1 TABLET BY MOUTH EVERY DAY 90 tablet 1   cholecalciferol (VITAMIN D) 1000 UNITS tablet Take 1,000 Units by mouth at bedtime.      dapagliflozin propanediol  (FARXIGA) 10 MG TABS tablet Take 1 tablet (10 mg total) by mouth daily before breakfast. 30 tablet 3   metoprolol succinate (TOPROL-XL) 100 MG 24 hr tablet TAKE 1 TABLET BY MOUTH EVERY DAY 90 tablet 3   nirmatrelvir & ritonavir (PAXLOVID, 300/100,) 20 x 150 MG & 10 x 100MG  TBPK UUD, 3 tabs twice daily 30 tablet 0   rosuvastatin (CRESTOR) 20 MG tablet TAKE 1 TABLET BY MOUTH EVERY DAY 90 tablet 1   valsartan (DIOVAN) 160 MG tablet TAKE 1 TABLET BY MOUTH EVERY DAY 90 tablet 3   No current facility-administered medications for this visit.    Allergies:   Amlodipine, Adhesive [tape], and Raloxifene    Social History:   reports that she quit smoking about 47 years ago. Her smoking use included cigarettes. She has never used smokeless tobacco. She reports current alcohol use of about 7.0 standard drinks of alcohol per week. She reports that she does not use drugs.   Family History:  family history is not on file.    ROS:     Review of Systems  Constitutional: Negative.   HENT: Negative.    Eyes: Negative.   Respiratory: Negative.    Gastrointestinal: Negative.   Genitourinary: Negative.   Musculoskeletal: Negative.   Skin: Negative.   Neurological: Negative.   Endo/Heme/Allergies: Negative.   Psychiatric/Behavioral: Negative.  All other systems reviewed and are negative.     All other systems are reviewed and negative.    PHYSICAL EXAM: VS:  BP 127/65   Pulse (!) 56   Ht 5\' 6"  (1.676 m)   Wt 175 lb 3.2 oz (79.5 kg)   SpO2 97%   BMI 28.28 kg/m  , BMI Body mass index is 28.28 kg/m. Last weight:  Wt Readings from Last 3 Encounters:  12/07/22 175 lb 3.2 oz (79.5 kg)  11/21/22 175 lb 3.2 oz (79.5 kg)  11/06/22 178 lb (80.7 kg)     Physical Exam Constitutional:      Appearance: Normal appearance.  Cardiovascular:     Rate and Rhythm: Normal rate and regular rhythm.     Heart sounds: Normal heart sounds.  Pulmonary:     Effort: Pulmonary effort is normal.     Breath  sounds: Normal breath sounds.  Musculoskeletal:     Right lower leg: No edema.     Left lower leg: No edema.  Neurological:     Mental Status: She is alert.       EKG:   Recent Labs: 08/23/2022: ALT 22; BUN 24; Creatinine, Ser 1.04; Potassium 4.1; Sodium 143    Lipid Panel    Component Value Date/Time   CHOL 156 08/23/2022 1001   TRIG 233 (H) 08/23/2022 1001   HDL 58 08/23/2022 1001   LDLCALC 61 08/23/2022 1001      Other studies Reviewed: Additional studies/ records that were reviewed today include:  Review of the above records demonstrates:       No data to display            ASSESSMENT AND PLAN:    ICD-10-CM   1. Mixed hyperlipidemia  E78.2     2. Essential hypertension, benign  I10     3. Chronic heart failure with preserved ejection fraction (HCC)  I50.32    Better with Farxiga, no longer SOB    4. SOB (shortness of breath)  R06.02        Problem List Items Addressed This Visit       Cardiovascular and Mediastinum   Essential hypertension, benign     Other   HLD (hyperlipidemia) - Primary   Other Visit Diagnoses     Chronic heart failure with preserved ejection fraction (HCC)       Better with Marcelline Deist, no longer SOB   SOB (shortness of breath)              Disposition:   Return in about 3 months (around 03/08/2023).    Total time spent: 30 minutes  Signed,  Adrian Blackwater, MD  12/07/2022 11:25 AM    [facility]

## 2022-12-29 ENCOUNTER — Ambulatory Visit: Payer: Medicare HMO | Admitting: Internal Medicine

## 2023-01-04 ENCOUNTER — Ambulatory Visit: Payer: Medicare HMO | Admitting: Dermatology

## 2023-01-04 ENCOUNTER — Encounter: Payer: Self-pay | Admitting: Dermatology

## 2023-01-04 DIAGNOSIS — W908XXA Exposure to other nonionizing radiation, initial encounter: Secondary | ICD-10-CM | POA: Diagnosis not present

## 2023-01-04 DIAGNOSIS — Z1283 Encounter for screening for malignant neoplasm of skin: Secondary | ICD-10-CM

## 2023-01-04 DIAGNOSIS — Z872 Personal history of diseases of the skin and subcutaneous tissue: Secondary | ICD-10-CM | POA: Diagnosis not present

## 2023-01-04 DIAGNOSIS — L578 Other skin changes due to chronic exposure to nonionizing radiation: Secondary | ICD-10-CM | POA: Diagnosis not present

## 2023-01-04 DIAGNOSIS — Z85828 Personal history of other malignant neoplasm of skin: Secondary | ICD-10-CM | POA: Diagnosis not present

## 2023-01-04 DIAGNOSIS — D692 Other nonthrombocytopenic purpura: Secondary | ICD-10-CM

## 2023-01-04 DIAGNOSIS — L720 Epidermal cyst: Secondary | ICD-10-CM | POA: Diagnosis not present

## 2023-01-04 DIAGNOSIS — L814 Other melanin hyperpigmentation: Secondary | ICD-10-CM | POA: Diagnosis not present

## 2023-01-04 DIAGNOSIS — D229 Melanocytic nevi, unspecified: Secondary | ICD-10-CM

## 2023-01-04 DIAGNOSIS — D1801 Hemangioma of skin and subcutaneous tissue: Secondary | ICD-10-CM | POA: Diagnosis not present

## 2023-01-04 DIAGNOSIS — L729 Follicular cyst of the skin and subcutaneous tissue, unspecified: Secondary | ICD-10-CM

## 2023-01-04 DIAGNOSIS — Z8589 Personal history of malignant neoplasm of other organs and systems: Secondary | ICD-10-CM

## 2023-01-04 DIAGNOSIS — L820 Inflamed seborrheic keratosis: Secondary | ICD-10-CM

## 2023-01-04 DIAGNOSIS — L821 Other seborrheic keratosis: Secondary | ICD-10-CM

## 2023-01-04 NOTE — Progress Notes (Signed)
Follow-Up Visit   Subjective  [name] is a 82 y.o. female who presents for the following: Skin Cancer Screening and Full Body Skin Exam  The patient presents for Total-Body Skin Exam (TBSE) for skin cancer screening and mole check. The patient has spots, moles and lesions to be evaluated, some may be new or changing and the patient may have concern these could be cancer.  Patient with hx of SCC, AK's. Patient had ISK at left lateral thigh treated in August, still residual.   The following portions of the chart were reviewed this encounter and updated as appropriate: medications, allergies, medical history  Review of Systems:  No other skin or systemic complaints except as noted in HPI or Assessment and Plan.  Objective  Well appearing patient in no apparent distress; mood and affect are within normal limits.  A full examination was performed including scalp, head, eyes, ears, nose, lips, neck, chest, axillae, abdomen, back, buttocks, bilateral upper extremities, bilateral lower extremities, hands, feet, fingers, toes, fingernails, and toenails. All findings within normal limits unless otherwise noted below.   Relevant physical exam findings are noted in the Assessment and Plan.  left lateral thigh Erythematous stuck-on, waxy papule or plaque    Assessment & Plan   SKIN CANCER SCREENING PERFORMED TODAY.  ACTINIC DAMAGE - Chronic condition, secondary to cumulative UV/sun exposure - diffuse scaly erythematous macules with underlying dyspigmentation - Recommend daily broad spectrum sunscreen SPF 30+ to sun-exposed areas, reapply every 2 hours as needed.  - Staying in the shade or wearing long sleeves, sun glasses (UVA+UVB protection) and wide brim hats (4-inch brim around the entire circumference of the hat) are also recommended for sun protection.  - Call for new or changing lesions.  LENTIGINES, SEBORRHEIC KERATOSES, HEMANGIOMAS - Benign normal skin lesions -  Benign-appearing - Call for any changes  MELANOCYTIC NEVI - Tan-brown and/or pink-flesh-colored symmetric macules and papules - Benign appearing on exam today - Observation - Call clinic for new or changing moles - Recommend daily use of broad spectrum spf 30+ sunscreen to sun-exposed areas.    HISTORY OF SQUAMOUS CELL CARCINOMA OF THE SKIN - No evidence of recurrence today at left medial cheek infraorbital, excised 06/2017 - No lymphadenopathy - Recommend regular full body skin exams - Recommend daily broad spectrum sunscreen SPF 30+ to sun-exposed areas, reapply every 2 hours as needed.  - Call if any new or changing lesions are noted between office visits  EPIDERMAL INCLUSION CYST Exam: Subcutaneous nodule at right scapula 1.0 cm  Benign-appearing. Exam most consistent with an epidermal inclusion cyst. Discussed that a cyst is a benign growth that can grow over time and sometimes get irritated or inflamed. Recommend observation if it is not bothersome. Discussed option of surgical excision to remove it if it is growing, symptomatic, or other changes noted. Please call for new or changing lesions so they can be evaluated.  Purpura - Chronic; persistent and recurrent.  Treatable, but not curable. - Violaceous macules and patches - Benign - Related to trauma, age, sun damage and/or use of blood thinners, chronic use of topical and/or oral steroids - Observe - Can use OTC arnica containing moisturizer such as Dermend Bruise Formula if desired - Call for worsening or other concerns      Inflamed seborrheic keratosis left lateral thigh  Symptomatic, irritating, patient would like treated.  Benign-appearing.  Call clinic for new or changing lesions.   Patient advised to RTC if not resolved.  Destruction of lesion - left lateral thigh  Destruction method: cryotherapy   Informed consent: discussed and consent obtained   Lesion destroyed using liquid nitrogen: Yes    Cryotherapy cycles:  2 Outcome: patient tolerated procedure well with no complications   Post-procedure details: wound care instructions given     Return in about 1 year (around 01/04/2024) for TBSE, with Dr. Kirtland Bouchard, Hx SCC, Hx AK, ISK follow up.  Anise Salvo, RMA, am acting as scribe for Armida Sans, MD .   Documentation: I have reviewed the above documentation for accuracy and completeness, and I agree with the above.  Armida Sans, MD

## 2023-01-04 NOTE — Patient Instructions (Signed)

## 2023-01-05 ENCOUNTER — Encounter: Payer: Self-pay | Admitting: Internal Medicine

## 2023-01-05 ENCOUNTER — Ambulatory Visit (INDEPENDENT_AMBULATORY_CARE_PROVIDER_SITE_OTHER): Payer: Medicare HMO | Admitting: Internal Medicine

## 2023-01-05 VITALS — BP 112/78 | HR 52 | Ht 66.0 in | Wt 174.6 lb

## 2023-01-05 DIAGNOSIS — I10 Essential (primary) hypertension: Secondary | ICD-10-CM

## 2023-01-05 DIAGNOSIS — R739 Hyperglycemia, unspecified: Secondary | ICD-10-CM

## 2023-01-05 DIAGNOSIS — E782 Mixed hyperlipidemia: Secondary | ICD-10-CM | POA: Diagnosis not present

## 2023-01-05 NOTE — Progress Notes (Signed)
Established Patient Office Visit  Subjective:  Patient ID: [name], female    DOB: 12-30-1940  Age: 82 y.o. MRN: [id number]  Chief Complaint  Patient presents with   Follow-up    4 month follow up    Patient is here for her regular follow-up.  She recently had a COVID infection and was treated with Paxlovid.  Today she is feeling much better and has no new complaints.  Needs labs today.    No other concerns at this time.   Past Medical History:  Diagnosis Date   Actinic keratosis 12/19/2018   Right lat. antecubital area. Hypertrophic.   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 Excised: 07/10/2017    Past Surgical History:  Procedure Laterality Date   COLONOSCOPY N/A 08/06/2014   Procedure: COLONOSCOPY;  Surgeon: Scot Jun, MD;  Location[facility] ENDOSCOPY;  Service: Endoscopy;  Laterality: N/A;   IR NEPHROSTOMY PLACEMENT LEFT  09/24/2017   LAPAROSCOPIC NEPHRECTOMY, HAND ASSISTED Left 11/05/2017   Procedure: HAND ASSISTED LAPAROSCOPIC NEPHRECTOMY;  Surgeon: Vanna Scotland, MD;  Location: [facility] ORS;  Service: Urology;  Laterality: Left;   NEPHROSTOMY TUBE REMOVAL Left 11/05/2017   Procedure: NEPHROSTOMY TUBE REMOVAL;  Surgeon: Vanna Scotland, MD;  Location: [facility] ORS;  Service: Urology;  Laterality: Left;   SHOULDER ARTHROSCOPY Right 2014   TONSILLECTOMY     TUBAL LIGATION      Social History   Socioeconomic History   Marital status: Married    Spouse name: Not on file   Number of children: Not on file   Years of education: Not on file   Highest education level: Not on file  Occupational History   Not on file  Tobacco Use   Smoking status: Former    Current packs/day: 0.00    Types: Cigarettes    Quit date: 03/28/1975    Years since quitting:  47.8   Smokeless tobacco: Never  Vaping Use   Vaping status: Never Used  Substance and Sexual Activity   Alcohol use: Yes    Alcohol/week: 7.0 standard drinks of alcohol    Types: 7 Glasses of wine per week   Drug use: No   Sexual activity: Not Currently  Other Topics Concern   Not on file  Social History Narrative   Not on file   Social Determinants of Health   Financial Resource Strain: Not on file  Food Insecurity: Not on file  Transportation Needs: Not on file  Physical Activity: Not on file  Stress: Not on file  Social Connections: Not on file  Intimate Partner Violence: Not on file    History reviewed. No pertinent family history.  Allergies  Allergen Reactions   Amlodipine Swelling   Adhesive [Tape] Other (See Comments)    Skin blisters if left on too long. Paper tape is okay   Raloxifene Other (See Comments)    (evista) Hot flashes    Review of Systems  Constitutional: Negative.  Negative for chills, diaphoresis, fever, malaise/fatigue and weight loss.  HENT: Negative.  Negative for congestion, hearing loss and sore throat.   Eyes: Negative.   Respiratory: Negative.  Negative for cough and shortness of breath.   Cardiovascular: Negative.  Negative for  chest pain, palpitations and leg swelling.  Gastrointestinal: Negative.  Negative for abdominal pain, constipation, diarrhea, heartburn, nausea and vomiting.  Genitourinary: Negative.  Negative for dysuria and flank pain.  Musculoskeletal: Negative.  Negative for joint pain and myalgias.  Skin: Negative.   Neurological: Negative.  Negative for dizziness and headaches.  Endo/Heme/Allergies: Negative.   Psychiatric/Behavioral: Negative.  Negative for depression and suicidal ideas. The patient is not nervous/anxious.        Objective:   BP 112/78   Pulse (!) 52   Ht 5\' 6"  (1.676 m)   Wt 174 lb 9.6 oz (79.2 kg)   SpO2 92%   BMI 28.18 kg/m   Vitals:   01/05/23 0900  BP: 112/78  Pulse: (!) 52   Height: 5\' 6"  (1.676 m)  Weight: 174 lb 9.6 oz (79.2 kg)  SpO2: 92%  BMI (Calculated): 28.19    Physical Exam Vitals and nursing note reviewed.  Constitutional:      Appearance: Normal appearance.  HENT:     Head: Normocephalic and atraumatic.     Nose: Nose normal.     Mouth/Throat:     Mouth: Mucous membranes are moist.     Pharynx: Oropharynx is clear.  Eyes:     Conjunctiva/sclera: Conjunctivae normal.     Pupils: Pupils are equal, round, and reactive to light.  Cardiovascular:     Rate and Rhythm: Normal rate and regular rhythm.     Pulses: Normal pulses.     Heart sounds: Normal heart sounds. No murmur heard. Pulmonary:     Effort: Pulmonary effort is normal.     Breath sounds: Normal breath sounds. No wheezing.  Abdominal:     General: Bowel sounds are normal.     Palpations: Abdomen is soft.     Tenderness: There is no abdominal tenderness. There is no right CVA tenderness or left CVA tenderness.  Musculoskeletal:        General: Normal range of motion.     Cervical back: Normal range of motion.     Right lower leg: No edema.     Left lower leg: No edema.  Skin:    General: Skin is warm and dry.  Neurological:     General: No focal deficit present.     Mental Status: She is alert and oriented to person, place, and time.  Psychiatric:        Mood and Affect: Mood normal.        Behavior: Behavior normal.      No results found for any visits on 01/05/23.  Recent Results (from the past 2160 hour(s))  POCT XPERT XPRESS SARS COVID-2/FLU/RSV     Status: None   Collection Time: 11/21/22 12:53 PM  Result Value Ref Range   SARS Coronavirus 2 pos    FLU A neg    FLU B neg    RSV RNA, PCR neg       Assessment & Plan:  Continue all medications.  Check blood work. Problem List Items Addressed This Visit     HLD (hyperlipidemia)   Relevant Orders   Lipid Panel w/o Chol/HDL Ratio   Essential hypertension, benign - Primary   Relevant Orders   CMP14+EGFR    Other Visit Diagnoses     Blood glucose elevated       Relevant Orders   Hemoglobin A1c       Return in about 4 months (around 05/08/2023).   Total time spent: 25 minutes  Margaretann Loveless,  MD  01/05/2023   This document may have been prepared by [facility] Voice Recognition software and as such may include unintentional dictation errors.

## 2023-01-06 LAB — LIPID PANEL W/O CHOL/HDL RATIO
Cholesterol, Total: 155 mg/dL (ref 100–199)
HDL: 58 mg/dL (ref >39)
LDL Chol Calc (NIH): 67 mg/dL (ref 0–99)
Triglycerides: 184 mg/dL — ABNORMAL HIGH (ref 0–149)
VLDL Cholesterol Cal: 30 mg/dL (ref 5–40)

## 2023-01-06 LAB — CMP14+EGFR
ALT: 26 [IU]/L (ref 0–32)
AST: 29 [IU]/L (ref 0–40)
Albumin: 4.6 g/dL (ref 3.8–4.8)
Alkaline Phosphatase: 68 [IU]/L (ref 44–121)
BUN/Creatinine Ratio: 23 (ref 12–28)
BUN: 27 mg/dL (ref 8–27)
Bilirubin Total: 0.5 mg/dL (ref 0.0–1.2)
CO2: 26 mmol/L (ref 20–29)
Calcium: 9.2 mg/dL (ref 8.7–10.3)
Chloride: 102 mmol/L (ref 96–106)
Creatinine, Ser: 1.18 mg/dL — ABNORMAL HIGH (ref 0.57–1.00)
Globulin, Total: 2.4 g/dL (ref 1.5–4.5)
Glucose: 111 mg/dL — ABNORMAL HIGH (ref 70–99)
Potassium: 4 mmol/L (ref 3.5–5.2)
Sodium: 145 mmol/L — ABNORMAL HIGH (ref 134–144)
Total Protein: 7 g/dL (ref 6.0–8.5)
eGFR: 47 mL/min/{1.73_m2} — ABNORMAL LOW (ref >59)

## 2023-01-06 LAB — HEMOGLOBIN A1C
Est. average glucose Bld gHb Est-mCnc: 117 mg/dL
Hgb A1c MFr Bld: 5.7 % — ABNORMAL HIGH (ref 4.8–5.6)

## 2023-01-08 NOTE — Progress Notes (Signed)
Patient notified

## 2023-01-09 ENCOUNTER — Encounter: Payer: Self-pay | Admitting: Dermatology

## 2023-03-02 ENCOUNTER — Other Ambulatory Visit: Payer: Self-pay | Admitting: Internal Medicine

## 2023-03-02 ENCOUNTER — Other Ambulatory Visit: Payer: Self-pay | Admitting: Cardiovascular Disease

## 2023-03-02 DIAGNOSIS — E782 Mixed hyperlipidemia: Secondary | ICD-10-CM

## 2023-03-02 DIAGNOSIS — I10 Essential (primary) hypertension: Secondary | ICD-10-CM

## 2023-03-02 DIAGNOSIS — I5032 Chronic diastolic (congestive) heart failure: Secondary | ICD-10-CM

## 2023-03-02 MED ORDER — DAPAGLIFLOZIN PROPANEDIOL 10 MG PO TABS
10.0000 mg | ORAL_TABLET | Freq: Every day | ORAL | 3 refills | Status: DC
Start: 2023-03-02 — End: 2024-02-20

## 2023-03-03 ENCOUNTER — Other Ambulatory Visit: Payer: Self-pay | Admitting: Family

## 2023-03-03 DIAGNOSIS — E782 Mixed hyperlipidemia: Secondary | ICD-10-CM

## 2023-03-09 ENCOUNTER — Encounter: Payer: Self-pay | Admitting: Cardiovascular Disease

## 2023-03-09 ENCOUNTER — Ambulatory Visit: Payer: Medicare HMO | Admitting: Cardiovascular Disease

## 2023-03-09 VITALS — BP 120/82 | HR 60 | Ht 66.0 in | Wt 172.6 lb

## 2023-03-09 DIAGNOSIS — E782 Mixed hyperlipidemia: Secondary | ICD-10-CM | POA: Diagnosis not present

## 2023-03-09 DIAGNOSIS — R0602 Shortness of breath: Secondary | ICD-10-CM | POA: Diagnosis not present

## 2023-03-09 DIAGNOSIS — I10 Essential (primary) hypertension: Secondary | ICD-10-CM

## 2023-03-09 DIAGNOSIS — I340 Nonrheumatic mitral (valve) insufficiency: Secondary | ICD-10-CM | POA: Diagnosis not present

## 2023-03-09 DIAGNOSIS — I5032 Chronic diastolic (congestive) heart failure: Secondary | ICD-10-CM | POA: Diagnosis not present

## 2023-03-09 NOTE — Progress Notes (Signed)
Cardiology Office Note   Date:  03/09/2023   ID:  [name], DOB [birth year], MRN [id number]  PCP:  Margaretann Loveless, MD  Cardiologist:  Adrian Blackwater, MD      History of Present Illness: [name] is a 82 y.o. female who presents for  Chief Complaint  Patient presents with   Follow-up    3 month follow up     Doing well, would not pay for farxiga      Past Medical History:  Diagnosis Date   Actinic keratosis 12/19/2018   Right lat. antecubital area. Hypertrophic.   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 Excised: 07/10/2017     Past Surgical History:  Procedure Laterality Date   COLONOSCOPY N/A 08/06/2014   Procedure: COLONOSCOPY;  Surgeon: Scot Jun, MD;  Location[facility] ENDOSCOPY;  Service: Endoscopy;  Laterality: N/A;   IR NEPHROSTOMY PLACEMENT LEFT  09/24/2017   LAPAROSCOPIC NEPHRECTOMY, HAND ASSISTED Left 11/05/2017   Procedure: HAND ASSISTED LAPAROSCOPIC NEPHRECTOMY;  Surgeon: Vanna Scotland, MD;  Location: [facility] ORS;  Service: Urology;  Laterality: Left;   NEPHROSTOMY TUBE REMOVAL Left 11/05/2017   Procedure: NEPHROSTOMY TUBE REMOVAL;  Surgeon: Vanna Scotland, MD;  Location: [facility] ORS;  Service: Urology;  Laterality: Left;   SHOULDER ARTHROSCOPY Right 2014   TONSILLECTOMY     TUBAL LIGATION       Current Outpatient Medications  Medication Sig Dispense Refill   acetaminophen (TYLENOL) 500 MG tablet Take 1,000 mg by mouth every 6 (six) hours as needed (for pain.).     Azelastine HCl 137 MCG/SPRAY SOLN 1-2 puffs in each nostril Nasally Twice a day for 30 day(s)     chlorthalidone (HYGROTON) 25 MG tablet TAKE 1 TABLET BY MOUTH EVERY DAY 90 tablet 1   cholecalciferol (VITAMIN D) 1000 UNITS tablet Take 1,000 Units by mouth at  bedtime.      dapagliflozin propanediol (FARXIGA) 10 MG TABS tablet Take 1 tablet (10 mg total) by mouth daily before breakfast. 90 tablet 3   metoprolol succinate (TOPROL-XL) 100 MG 24 hr tablet TAKE 1 TABLET BY MOUTH EVERY DAY 90 tablet 3   nirmatrelvir & ritonavir (PAXLOVID, 300/100,) 20 x 150 MG & 10 x 100MG  TBPK UUD, 3 tabs twice daily (Patient not taking: Reported on 01/05/2023) 30 tablet 0   rosuvastatin (CRESTOR) 20 MG tablet TAKE 1 TABLET BY MOUTH EVERY DAY 90 tablet 1   valsartan (DIOVAN) 160 MG tablet TAKE 1 TABLET BY MOUTH EVERY DAY 90 tablet 3   No current facility-administered medications for this visit.    Allergies:   Amlodipine, Adhesive [tape], and Raloxifene    Social History:   reports that she quit smoking about 47 years ago. Her smoking use included cigarettes. She has never used smokeless tobacco. She reports current alcohol use of about 7.0 standard drinks of alcohol per week. She reports that she does not use drugs.   Family History:  family history is not on file.    ROS:     Review of Systems  Constitutional: Negative.   HENT: Negative.    Eyes: Negative.   Respiratory: Negative.    Gastrointestinal: Negative.   Genitourinary: Negative.   Musculoskeletal:  Negative.   Skin: Negative.   Neurological: Negative.   Endo/Heme/Allergies: Negative.   Psychiatric/Behavioral: Negative.    All other systems reviewed and are negative.     All other systems are reviewed and negative.    PHYSICAL EXAM: VS:  BP 120/82   Pulse 60   Ht 5\' 6"  (1.676 m)   Wt 172 lb 9.6 oz (78.3 kg)   SpO2 94%   BMI 27.86 kg/m  , BMI Body mass index is 27.86 kg/m. Last weight:  Wt Readings from Last 3 Encounters:  03/09/23 172 lb 9.6 oz (78.3 kg)  01/05/23 174 lb 9.6 oz (79.2 kg)  12/07/22 175 lb 3.2 oz (79.5 kg)     Physical Exam Constitutional:      Appearance: Normal appearance.  Cardiovascular:     Rate and Rhythm: Normal rate and regular rhythm.     Heart  sounds: Normal heart sounds.  Pulmonary:     Effort: Pulmonary effort is normal.     Breath sounds: Normal breath sounds.  Musculoskeletal:     Right lower leg: No edema.     Left lower leg: No edema.  Neurological:     Mental Status: She is alert.       EKG:   Recent Labs: 01/05/2023: ALT 26; BUN 27; Creatinine, Ser 1.18; Potassium 4.0; Sodium 145    Lipid Panel    Component Value Date/Time   CHOL 155 01/05/2023 0938   TRIG 184 (H) 01/05/2023 0938   HDL 58 01/05/2023 0938   LDLCALC 67 01/05/2023 0938      Other studies Reviewed: Additional studies/ records that were reviewed today include:  Review of the above records demonstrates:       No data to display            ASSESSMENT AND PLAN:    ICD-10-CM   1. Essential hypertension, benign  I10     2. Mixed hyperlipidemia  E78.2     3. Chronic heart failure with preserved ejection fraction (HCC)  I50.32     4. SOB (shortness of breath)  R06.02    Can change to jaurdiance if insurance do not pay    5. 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    Total time spent: 30 minutes  Signed,  Adrian Blackwater, MD  03/09/2023 10:49 AM    [facility]

## 2023-05-11 ENCOUNTER — Ambulatory Visit (INDEPENDENT_AMBULATORY_CARE_PROVIDER_SITE_OTHER): Payer: HMO | Admitting: Internal Medicine

## 2023-05-11 ENCOUNTER — Encounter: Payer: Self-pay | Admitting: Internal Medicine

## 2023-05-11 VITALS — BP 128/78 | HR 60 | Ht 66.0 in | Wt 173.4 lb

## 2023-05-11 DIAGNOSIS — R7309 Other abnormal glucose: Secondary | ICD-10-CM | POA: Diagnosis not present

## 2023-05-11 DIAGNOSIS — I10 Essential (primary) hypertension: Secondary | ICD-10-CM

## 2023-05-11 DIAGNOSIS — E782 Mixed hyperlipidemia: Secondary | ICD-10-CM

## 2023-05-11 DIAGNOSIS — J301 Allergic rhinitis due to pollen: Secondary | ICD-10-CM | POA: Diagnosis not present

## 2023-05-11 NOTE — Progress Notes (Signed)
Established Patient Office Visit  Subjective:  Patient ID: [name], female    DOB: 04-19-1940  Age: 83 y.o. MRN: [id number]  Chief Complaint  Patient presents with   Follow-up    4 month follow up    Patient comes in for her follow-up today.  She is feeling well and has no new complaints.  She is fasting for blood work. Taking all her medications regularly.    No other concerns at this time.   Past Medical History:  Diagnosis Date   Actinic keratosis 12/19/2018   Right lat. antecubital area. Hypertrophic.   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 Excised: 07/10/2017    Past Surgical History:  Procedure Laterality Date   COLONOSCOPY N/A 08/06/2014   Procedure: COLONOSCOPY;  Surgeon: Scot Jun, MD;  Location: [facility] ENDOSCOPY;  Service: Endoscopy;  Laterality: N/A;   IR NEPHROSTOMY PLACEMENT LEFT  09/24/2017   LAPAROSCOPIC NEPHRECTOMY, HAND ASSISTED Left 11/05/2017   Procedure: HAND ASSISTED LAPAROSCOPIC NEPHRECTOMY;  Surgeon: Vanna Scotland, MD;  Location: [facility] ORS;  Service: Urology;  Laterality: Left;   NEPHROSTOMY TUBE REMOVAL Left 11/05/2017   Procedure: NEPHROSTOMY TUBE REMOVAL;  Surgeon: Vanna Scotland, MD;  Location: [facility] ORS;  Service: Urology;  Laterality: Left;   SHOULDER ARTHROSCOPY Right 2014   TONSILLECTOMY     TUBAL LIGATION      Social History   Socioeconomic History   Marital status: Married    Spouse name: Not on file   Number of children: Not on file   Years of education: Not on file   Highest education level: Not on file  Occupational History   Not on file  Tobacco Use   Smoking status: Former    Current packs/day: 0.00    Types: Cigarettes    Quit date: 03/28/1975    Years since quitting: 48.1   Smokeless tobacco:  Never  Vaping Use   Vaping status: Never Used  Substance and Sexual Activity   Alcohol use: Yes    Alcohol/week: 7.0 standard drinks of alcohol    Types: 7 Glasses of wine per week   Drug use: No   Sexual activity: Not Currently  Other Topics Concern   Not on file  Social History Narrative   Not on file   Social Drivers of Health   Financial Resource Strain: Not on file  Food Insecurity: Not on file  Transportation Needs: Not on file  Physical Activity: Not on file  Stress: Not on file  Social Connections: Not on file  Intimate Partner Violence: Not on file    No family history on file.  Allergies  Allergen Reactions   Amlodipine Swelling   Adhesive [Tape] Other (See Comments)    Skin blisters if left on too long. Paper tape is okay   Raloxifene Other (See Comments)    (evista) Hot flashes    Outpatient Medications Prior to Visit  Medication Sig   acetaminophen (TYLENOL) 500 MG tablet Take 1,000 mg by mouth every 6 (six) hours as needed (for pain.).   Azelastine HCl 137 MCG/SPRAY SOLN 1-2 puffs in each nostril Nasally Twice a day for 30 day(s)   chlorthalidone (HYGROTON) 25 MG tablet TAKE 1 TABLET BY  MOUTH EVERY DAY   cholecalciferol (VITAMIN D) 1000 UNITS tablet Take 1,000 Units by mouth at bedtime.    dapagliflozin propanediol (FARXIGA) 10 MG TABS tablet Take 1 tablet (10 mg total) by mouth daily before breakfast.   metoprolol succinate (TOPROL-XL) 100 MG 24 hr tablet TAKE 1 TABLET BY MOUTH EVERY DAY   rosuvastatin (CRESTOR) 20 MG tablet TAKE 1 TABLET BY MOUTH EVERY DAY   valsartan (DIOVAN) 160 MG tablet TAKE 1 TABLET BY MOUTH EVERY DAY   nirmatrelvir & ritonavir (PAXLOVID, 300/100,) 20 x 150 MG & 10 x 100MG  TBPK UUD, 3 tabs twice daily (Patient not taking: Reported on 05/11/2023)   No facility-administered medications prior to visit.    Review of Systems  Constitutional: Negative.  Negative for chills, fever, malaise/fatigue and weight loss.  HENT:  Negative  for congestion and sinus pain.   Eyes: Negative.   Respiratory: Negative.  Negative for cough and shortness of breath.   Cardiovascular: Negative.  Negative for chest pain, palpitations and leg swelling.  Gastrointestinal: Negative.  Negative for abdominal pain, constipation, diarrhea, heartburn, nausea and vomiting.  Genitourinary: Negative.  Negative for dysuria and flank pain.  Musculoskeletal: Negative.  Negative for joint pain and myalgias.  Skin: Negative.   Neurological: Negative.  Negative for dizziness, tingling, sensory change, speech change and headaches.  Endo/Heme/Allergies: Negative.   Psychiatric/Behavioral: Negative.  Negative for depression and suicidal ideas. The patient is not nervous/anxious.        Objective:   BP 128/78   Pulse 60   Ht 5\' 6"  (1.676 m)   Wt 173 lb 6.4 oz (78.7 kg)   SpO2 96%   BMI 27.99 kg/m   Vitals:   05/11/23 0854  BP: 128/78  Pulse: 60  Height: 5\' 6"  (1.676 m)  Weight: 173 lb 6.4 oz (78.7 kg)  SpO2: 96%  BMI (Calculated): 28    Physical Exam Vitals and nursing note reviewed.  Constitutional:      Appearance: Normal appearance.  HENT:     Head: Normocephalic and atraumatic.     Nose: Nose normal.     Mouth/Throat:     Mouth: Mucous membranes are moist.     Pharynx: Oropharynx is clear.  Eyes:     Conjunctiva/sclera: Conjunctivae normal.     Pupils: Pupils are equal, round, and reactive to light.  Cardiovascular:     Rate and Rhythm: Normal rate and regular rhythm.     Pulses: Normal pulses.     Heart sounds: Normal heart sounds. No murmur heard. Pulmonary:     Effort: Pulmonary effort is normal.     Breath sounds: Normal breath sounds. No wheezing.  Abdominal:     General: Bowel sounds are normal.     Palpations: Abdomen is soft.     Tenderness: There is no abdominal tenderness. There is no right CVA tenderness or left CVA tenderness.  Musculoskeletal:        General: Normal range of motion.     Cervical back:  Normal range of motion.     Right lower leg: No edema.     Left lower leg: No edema.  Skin:    General: Skin is warm and dry.  Neurological:     General: No focal deficit present.     Mental Status: She is alert and oriented to person, place, and time.  Psychiatric:        Mood and Affect: Mood normal.        Behavior: Behavior  normal.      No results found for any visits on 05/11/23.  No results found for this or any previous visit (from the past 2160 hours).    Assessment & Plan:  Patient advised to continue all her medications.  Check blood work today. Problem List Items Addressed This Visit     Allergic rhinitis due to pollen   Relevant Orders   CBC with Diff   HLD (hyperlipidemia)   Relevant Orders   Lipid Panel w/o Chol/HDL Ratio   Essential hypertension, benign   Relevant Orders   CMP14+EGFR   Elevated glucose - Primary   Relevant Orders   Hemoglobin A1c    Return in about 4 months (around 09/08/2023).   Total time spent: 30 minutes  Margaretann Loveless, MD  05/11/2023   This document may have been prepared by [facility] Voice Recognition software and as such may include unintentional dictation errors.

## 2023-05-12 LAB — CMP14+EGFR
ALT: 46 [IU]/L — ABNORMAL HIGH (ref 0–32)
AST: 45 [IU]/L — ABNORMAL HIGH (ref 0–40)
Albumin: 4.3 g/dL (ref 3.7–4.7)
Alkaline Phosphatase: 75 [IU]/L (ref 44–121)
BUN/Creatinine Ratio: 19 (ref 12–28)
BUN: 24 mg/dL (ref 8–27)
Bilirubin Total: 0.6 mg/dL (ref 0.0–1.2)
CO2: 25 mmol/L (ref 20–29)
Calcium: 9.3 mg/dL (ref 8.7–10.3)
Chloride: 100 mmol/L (ref 96–106)
Creatinine, Ser: 1.24 mg/dL — ABNORMAL HIGH (ref 0.57–1.00)
Globulin, Total: 2.8 g/dL (ref 1.5–4.5)
Glucose: 110 mg/dL — ABNORMAL HIGH (ref 70–99)
Potassium: 4.1 mmol/L (ref 3.5–5.2)
Sodium: 140 mmol/L (ref 134–144)
Total Protein: 7.1 g/dL (ref 6.0–8.5)
eGFR: 44 mL/min/{1.73_m2} — ABNORMAL LOW (ref >59)

## 2023-05-12 LAB — CBC WITH DIFFERENTIAL/PLATELET
Basophils Absolute: 0.1 10*3/uL (ref 0.0–0.2)
Basos: 1 %
EOS (ABSOLUTE): 0.1 10*3/uL (ref 0.0–0.4)
Eos: 2 %
Hematocrit: 43.1 % (ref 34.0–46.6)
Hemoglobin: 14.1 g/dL (ref 11.1–15.9)
Immature Grans (Abs): 0 10*3/uL (ref 0.0–0.1)
Immature Granulocytes: 0 %
Lymphocytes Absolute: 1.8 10*3/uL (ref 0.7–3.1)
Lymphs: 29 %
MCH: 31.3 pg (ref 26.6–33.0)
MCHC: 32.7 g/dL (ref 31.5–35.7)
MCV: 96 fL (ref 79–97)
Monocytes Absolute: 0.7 10*3/uL (ref 0.1–0.9)
Monocytes: 11 %
Neutrophils Absolute: 3.6 10*3/uL (ref 1.4–7.0)
Neutrophils: 57 %
Platelets: 160 10*3/uL (ref 150–450)
RBC: 4.5 x10E6/uL (ref 3.77–5.28)
RDW: 12.9 % (ref 11.7–15.4)
WBC: 6.3 10*3/uL (ref 3.4–10.8)

## 2023-05-12 LAB — HEMOGLOBIN A1C
Est. average glucose Bld gHb Est-mCnc: 117 mg/dL
Hgb A1c MFr Bld: 5.7 % — ABNORMAL HIGH (ref 4.8–5.6)

## 2023-05-12 LAB — LIPID PANEL W/O CHOL/HDL RATIO
Cholesterol, Total: 154 mg/dL (ref 100–199)
HDL: 66 mg/dL (ref >39)
LDL Chol Calc (NIH): 60 mg/dL (ref 0–99)
Triglycerides: 170 mg/dL — ABNORMAL HIGH (ref 0–149)
VLDL Cholesterol Cal: 28 mg/dL (ref 5–40)

## 2023-05-15 ENCOUNTER — Ambulatory Visit: Payer: HMO | Admitting: Cardiology

## 2023-05-15 ENCOUNTER — Encounter: Payer: Self-pay | Admitting: Cardiology

## 2023-05-15 VITALS — BP 122/74 | HR 60 | Ht 66.0 in | Wt 170.0 lb

## 2023-05-15 DIAGNOSIS — I10 Essential (primary) hypertension: Secondary | ICD-10-CM | POA: Diagnosis not present

## 2023-05-15 DIAGNOSIS — I5032 Chronic diastolic (congestive) heart failure: Secondary | ICD-10-CM | POA: Diagnosis not present

## 2023-05-15 DIAGNOSIS — E782 Mixed hyperlipidemia: Secondary | ICD-10-CM | POA: Diagnosis not present

## 2023-05-15 NOTE — Assessment & Plan Note (Signed)
Has grade 1 diastolic dysfunction, on Farxiga, tolerating well. States shortness of breath has improved.

## 2023-05-15 NOTE — Assessment & Plan Note (Signed)
04/2023 LDL 60, continue rosuvastatin 20 mg daily.

## 2023-05-15 NOTE — Progress Notes (Signed)
Cardiology Office Note   Date:  05/15/2023   ID:  [name], DOB 1940-07-18, MRN [id number]  PCP:  Margaretann Loveless, MD  Cardiologist:  Marisue Ivan, NP      History of Present Illness: [name] is a 83 y.o. female who presents for  Chief Complaint  Patient presents with   Follow-up    2 month follow up     Patient in office for routine cardiac exam. Denies chest pain, shortness of breath, dizziness, lower extremity edema.        Past Medical History:  Diagnosis Date   Actinic keratosis 12/19/2018   Right lat. antecubital area. Hypertrophic.   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 Excised: 07/10/2017     Past Surgical History:  Procedure Laterality Date   COLONOSCOPY N/A 08/06/2014   Procedure: COLONOSCOPY;  Surgeon: Scot Jun, MD;  Location: [facility] ENDOSCOPY;  Service: Endoscopy;  Laterality: N/A;   IR NEPHROSTOMY PLACEMENT LEFT  09/24/2017   LAPAROSCOPIC NEPHRECTOMY, HAND ASSISTED Left 11/05/2017   Procedure: HAND ASSISTED LAPAROSCOPIC NEPHRECTOMY;  Surgeon: Vanna Scotland, MD;  Location: [facility] ORS;  Service: Urology;  Laterality: Left;   NEPHROSTOMY TUBE REMOVAL Left 11/05/2017   Procedure: NEPHROSTOMY TUBE REMOVAL;  Surgeon: Vanna Scotland, MD;  Location: [facility] ORS;  Service: Urology;  Laterality: Left;   SHOULDER ARTHROSCOPY Right 2014   TONSILLECTOMY     TUBAL LIGATION       Current Outpatient Medications  Medication Sig Dispense Refill   acetaminophen (TYLENOL) 500 MG tablet Take 1,000 mg by mouth every 6 (six) hours as needed (for pain.).     Azelastine HCl 137 MCG/SPRAY SOLN 1-2 puffs in each nostril Nasally Twice a day for 30 day(s)     chlorthalidone (HYGROTON) 25 MG tablet TAKE 1 TABLET BY MOUTH EVERY DAY 90 tablet 1    cholecalciferol (VITAMIN D) 1000 UNITS tablet Take 1,000 Units by mouth at bedtime.      dapagliflozin propanediol (FARXIGA) 10 MG TABS tablet Take 1 tablet (10 mg total) by mouth daily before breakfast. 90 tablet 3   metoprolol succinate (TOPROL-XL) 100 MG 24 hr tablet TAKE 1 TABLET BY MOUTH EVERY DAY 90 tablet 3   rosuvastatin (CRESTOR) 20 MG tablet TAKE 1 TABLET BY MOUTH EVERY DAY 90 tablet 1   valsartan (DIOVAN) 160 MG tablet TAKE 1 TABLET BY MOUTH EVERY DAY 90 tablet 3   No current facility-administered medications for this visit.    Allergies:   Amlodipine, Adhesive [tape], and Raloxifene    Social History:   reports that she quit smoking about 48 years ago. Her smoking use included cigarettes. She has never used smokeless tobacco. She reports current alcohol use of about 7.0 standard drinks of alcohol per week. She reports that she does not use drugs.   Family History:  family history is not on file.    ROS:     Review of Systems  Constitutional: Negative.   HENT: Negative.    Eyes: Negative.   Respiratory: Negative.    Cardiovascular: Negative.   Gastrointestinal: Negative.   Genitourinary: Negative.   Musculoskeletal: Negative.   Skin: Negative.   Neurological: Negative.   Endo/Heme/Allergies: Negative.  Psychiatric/Behavioral: Negative.    All other systems reviewed and are negative.     All other systems are reviewed and negative.    PHYSICAL EXAM: VS:  BP 122/74   Pulse 60   Ht 5\' 6"  (1.676 m)   Wt 170 lb (77.1 kg)   SpO2 94%   BMI 27.44 kg/m  , BMI Body mass index is 27.44 kg/m. Last weight:  Wt Readings from Last 3 Encounters:  05/15/23 170 lb (77.1 kg)  05/11/23 173 lb 6.4 oz (78.7 kg)  03/09/23 172 lb 9.6 oz (78.3 kg)     Physical Exam Vitals and nursing note reviewed.  Constitutional:      Appearance: Normal appearance. She is normal weight.  HENT:     Head: Normocephalic and atraumatic.     Nose: Nose normal.     Mouth/Throat:      Mouth: Mucous membranes are moist.     Pharynx: Oropharynx is clear.  Eyes:     Conjunctiva/sclera: Conjunctivae normal.     Pupils: Pupils are equal, round, and reactive to light.  Cardiovascular:     Rate and Rhythm: Normal rate and regular rhythm.     Pulses: Normal pulses.     Heart sounds: Normal heart sounds.  Pulmonary:     Effort: Pulmonary effort is normal.     Breath sounds: Normal breath sounds.  Abdominal:     General: Abdomen is flat. Bowel sounds are normal.     Palpations: Abdomen is soft.  Musculoskeletal:        General: Normal range of motion.     Cervical back: Normal range of motion.  Skin:    General: Skin is warm and dry.  Neurological:     General: No focal deficit present.     Mental Status: She is alert and oriented to person, place, and time. Mental status is at baseline.  Psychiatric:        Mood and Affect: Mood normal.        Behavior: Behavior normal.     EKG: none today  Recent Labs: 05/11/2023: ALT 46; BUN 24; Creatinine, Ser 1.24; Hemoglobin 14.1; Platelets 160; Potassium 4.1; Sodium 140    Lipid Panel    Component Value Date/Time   CHOL 154 05/11/2023 0919   TRIG 170 (H) 05/11/2023 0919   HDL 66 05/11/2023 0919   LDLCALC 60 05/11/2023 0919     ASSESSMENT AND PLAN:    ICD-10-CM   1. Essential hypertension, benign  I10     2. Mixed hyperlipidemia  E78.2     3. Chronic heart failure with preserved ejection fraction (HCC)  I50.32    has grade 1 diastolic dysfunction, place on farxiga       Problem List Items Addressed This Visit       Cardiovascular and Mediastinum   Essential hypertension, benign - Primary   Well controlled, continue same medications.       Chronic heart failure with preserved ejection fraction (HCC)   Has grade 1 diastolic dysfunction, on Farxiga, tolerating well. States shortness of breath has improved.         Other   HLD (hyperlipidemia)   04/2023 LDL 60, continue rosuvastatin 20 mg daily.          Disposition:   Return in about 4 months (around 09/12/2023).    Total time spent: 25 minutes  Signed,  Google, NP  05/15/2023 11:07 AM    [facility]

## 2023-05-15 NOTE — Assessment & Plan Note (Signed)
Well controlled, continue same medications.  

## 2023-05-17 NOTE — Progress Notes (Signed)
 Patient notified

## 2023-05-31 ENCOUNTER — Other Ambulatory Visit: Payer: Self-pay | Admitting: Internal Medicine

## 2023-05-31 ENCOUNTER — Encounter: Payer: Self-pay | Admitting: Cardiology

## 2023-05-31 ENCOUNTER — Ambulatory Visit: Admitting: Cardiology

## 2023-05-31 VITALS — BP 108/62 | HR 61 | Temp 97.5°F | Ht 66.0 in | Wt 168.0 lb

## 2023-05-31 DIAGNOSIS — J060 Acute laryngopharyngitis: Secondary | ICD-10-CM | POA: Diagnosis not present

## 2023-05-31 DIAGNOSIS — J069 Acute upper respiratory infection, unspecified: Secondary | ICD-10-CM | POA: Insufficient documentation

## 2023-05-31 DIAGNOSIS — R7303 Prediabetes: Secondary | ICD-10-CM

## 2023-05-31 DIAGNOSIS — R000 Tachycardia, unspecified: Secondary | ICD-10-CM

## 2023-05-31 MED ORDER — AMOXICILLIN-POT CLAVULANATE 875-125 MG PO TABS: 1.0000 | ORAL_TABLET | Freq: Two times a day (BID) | ORAL | 0 refills | Status: AC

## 2023-05-31 MED ORDER — BENZONATATE 100 MG PO CAPS: 100.0000 mg | ORAL_CAPSULE | Freq: Three times a day (TID) | ORAL | 1 refills | Status: DC | PRN

## 2023-05-31 MED ORDER — FLUTICASONE PROPIONATE 50 MCG/ACT NA SUSP
1.0000 | Freq: Every day | NASAL | 2 refills | Status: DC
Start: 2023-05-31 — End: 2023-09-06

## 2023-05-31 NOTE — Progress Notes (Signed)
 Established Patient Office Visit  Subjective:  Patient ID: [name], female    DOB: 05/20/1940  Age: 83 y.o. MRN: [id number]  Chief Complaint  Patient presents with   Acute Visit    Cough, Runny Nose, Congestion x 1 week    Patient in office for an acute visit. Patient complaining of cough, runny nose, and congestion for the past week. Also complaining of rhinorrhea and PND. Will send in Flonase. Patient educated on proper use of nasal spray. Augmentin and Tessalon pearls sent in. Continue Mucinex, increase water intake, and rest.   Cough This is a new problem. The current episode started in the past 7 days. The problem has been unchanged. The problem occurs every few minutes. The cough is Productive of sputum. Associated symptoms include nasal congestion, postnasal drip and rhinorrhea. Pertinent negatives include no chest pain, fever, headaches, myalgias, sore throat or shortness of breath. Nothing aggravates the symptoms. Treatments tried: Mucinex. The treatment provided no relief. There is no history of asthma, bronchitis, COPD, emphysema or pneumonia.    No other concerns at this time.   Past Medical History:  Diagnosis Date   Actinic keratosis 12/19/2018   Right lat. antecubital area. Hypertrophic.   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 Excised: 07/10/2017    Past Surgical History:  Procedure Laterality Date   COLONOSCOPY N/A 08/06/2014   Procedure: COLONOSCOPY;  Surgeon: Scot Jun, MD;  Location: [facility] ENDOSCOPY;  Service: Endoscopy;  Laterality: N/A;   IR NEPHROSTOMY PLACEMENT LEFT  09/24/2017   LAPAROSCOPIC NEPHRECTOMY, HAND ASSISTED Left 11/05/2017   Procedure: HAND ASSISTED LAPAROSCOPIC NEPHRECTOMY;  Surgeon: Vanna Scotland, MD;  Location:  [facility] ORS;  Service: Urology;  Laterality: Left;   NEPHROSTOMY TUBE REMOVAL Left 11/05/2017   Procedure: NEPHROSTOMY TUBE REMOVAL;  Surgeon: Vanna Scotland, MD;  Location: [facility] ORS;  Service: Urology;  Laterality: Left;   SHOULDER ARTHROSCOPY Right 2014   TONSILLECTOMY     TUBAL LIGATION      Social History   Socioeconomic History   Marital status: Married    Spouse name: Not on file   Number of children: Not on file   Years of education: Not on file   Highest education level: Not on file  Occupational History   Not on file  Tobacco Use   Smoking status: Former    Current packs/day: 0.00    Types: Cigarettes    Quit date: 03/28/1975    Years since quitting: 48.2   Smokeless tobacco: Never  Vaping Use   Vaping status: Never Used  Substance and Sexual Activity   Alcohol use: Yes    Alcohol/week: 7.0 standard drinks of alcohol    Types: 7 Glasses of wine per week   Drug use: No   Sexual activity: Not Currently  Other Topics Concern   Not on file  Social History Narrative   Not on file   Social Drivers of Health   Financial Resource Strain: Not on file  Food Insecurity: Not on file  Transportation Needs: Not on file  Physical Activity: Not on file  Stress: Not on file  Social Connections: Not on file  Intimate Partner Violence: Not on file    History reviewed.  No pertinent family history.  Allergies  Allergen Reactions   Amlodipine Swelling   Adhesive [Tape] Other (See Comments)    Skin blisters if left on too long. Paper tape is okay   Raloxifene Other (See Comments)    (evista) Hot flashes    Outpatient Medications Prior to Visit  Medication Sig   acetaminophen (TYLENOL) 500 MG tablet Take 1,000 mg by mouth every 6 (six) hours as needed (for pain.).   Azelastine HCl 137 MCG/SPRAY SOLN 1-2 puffs in each nostril Nasally Twice a day for 30 day(s)   chlorthalidone (HYGROTON) 25 MG tablet TAKE 1 TABLET BY MOUTH EVERY DAY   cholecalciferol (VITAMIN D) 1000 UNITS  tablet Take 1,000 Units by mouth at bedtime.    dapagliflozin propanediol (FARXIGA) 10 MG TABS tablet Take 1 tablet (10 mg total) by mouth daily before breakfast.   metoprolol succinate (TOPROL-XL) 100 MG 24 hr tablet TAKE 1 TABLET BY MOUTH EVERY DAY   rosuvastatin (CRESTOR) 20 MG tablet TAKE 1 TABLET BY MOUTH EVERY DAY   valsartan (DIOVAN) 160 MG tablet TAKE 1 TABLET BY MOUTH EVERY DAY   No facility-administered medications prior to visit.    Review of Systems  Constitutional: Negative.  Negative for fever.  HENT:  Positive for congestion, postnasal drip and rhinorrhea. Negative for sinus pain and sore throat.   Eyes: Negative.   Respiratory:  Positive for cough and sputum production. Negative for shortness of breath.   Cardiovascular: Negative.  Negative for chest pain.  Gastrointestinal: Negative.  Negative for abdominal pain, constipation and diarrhea.  Genitourinary: Negative.   Musculoskeletal:  Negative for joint pain and myalgias.  Skin: Negative.   Neurological: Negative.  Negative for dizziness and headaches.  Endo/Heme/Allergies: Negative.   All other systems reviewed and are negative.      Objective:   BP 108/62   Pulse 61   Temp (!) 97.5 F (36.4 C) (Oral)   Ht 5\' 6"  (1.676 m)   Wt 168 lb (76.2 kg)   SpO2 97%   BMI 27.12 kg/m   Vitals:   05/31/23 1020  BP: 108/62  Pulse: 61  Temp: (!) 97.5 F (36.4 C)  Height: 5\' 6"  (1.676 m)  Weight: 168 lb (76.2 kg)  SpO2: 97%  TempSrc: Oral  BMI (Calculated): 27.13    Physical Exam Vitals and nursing note reviewed.  Constitutional:      Appearance: Normal appearance. She is normal weight.  HENT:     Head: Normocephalic and atraumatic.     Nose: Nose normal.     Mouth/Throat:     Mouth: Mucous membranes are moist.  Eyes:     Extraocular Movements: Extraocular movements intact.     Conjunctiva/sclera: Conjunctivae normal.     Pupils: Pupils are equal, round, and reactive to light.  Cardiovascular:      Rate and Rhythm: Normal rate and regular rhythm.     Pulses: Normal pulses.     Heart sounds: Normal heart sounds.  Pulmonary:     Effort: Pulmonary effort is normal.     Breath sounds: Normal breath sounds.  Abdominal:     General: Abdomen is flat. Bowel sounds are normal.     Palpations: Abdomen is soft.  Musculoskeletal:        General: Normal range of motion.     Cervical back: Normal range of motion.  Skin:    General: Skin is warm and dry.  Neurological:     General: No focal deficit present.  Mental Status: She is alert and oriented to person, place, and time.  Psychiatric:        Mood and Affect: Mood normal.        Behavior: Behavior normal.        Thought Content: Thought content normal.        Judgment: Judgment normal.      No results found for any visits on 05/31/23.  Recent Results (from the past 2160 hours)  CMP14+EGFR     Status: Abnormal   Collection Time: 05/11/23  9:19 AM  Result Value Ref Range   Glucose 110 (H) 70 - 99 mg/dL   BUN 24 8 - 27 mg/dL   Creatinine, Ser 4.09 (H) 0.57 - 1.00 mg/dL   eGFR 44 (L) >81 XB/JYN/8.29   BUN/Creatinine Ratio 19 12 - 28   Sodium 140 134 - 144 mmol/L   Potassium 4.1 3.5 - 5.2 mmol/L   Chloride 100 96 - 106 mmol/L   CO2 25 20 - 29 mmol/L   Calcium 9.3 8.7 - 10.3 mg/dL   Total Protein 7.1 6.0 - 8.5 g/dL   Albumin 4.3 3.7 - 4.7 g/dL   Globulin, Total 2.8 1.5 - 4.5 g/dL   Bilirubin Total 0.6 0.0 - 1.2 mg/dL   Alkaline Phosphatase 75 44 - 121 IU/L   AST 45 (H) 0 - 40 IU/L   ALT 46 (H) 0 - 32 IU/L  Lipid Panel w/o Chol/HDL Ratio     Status: Abnormal   Collection Time: 05/11/23  9:19 AM  Result Value Ref Range   Cholesterol, Total 154 100 - 199 mg/dL   Triglycerides 562 (H) 0 - 149 mg/dL   HDL 66 >13 mg/dL   VLDL Cholesterol Cal 28 5 - 40 mg/dL   LDL Chol Calc (NIH) 60 0 - 99 mg/dL  Hemoglobin Y8M     Status: Abnormal   Collection Time: 05/11/23  9:19 AM  Result Value Ref Range   Hgb A1c MFr Bld 5.7 (H) 4.8  - 5.6 %    Comment:          Prediabetes: 5.7 - 6.4          Diabetes: >6.4          Glycemic control for adults with diabetes: <7.0    Est. average glucose Bld gHb Est-mCnc 117 mg/dL  CBC with Diff     Status: None   Collection Time: 05/11/23  9:19 AM  Result Value Ref Range   WBC 6.3 3.4 - 10.8 x10E3/uL   RBC 4.50 3.77 - 5.28 x10E6/uL   Hemoglobin 14.1 11.1 - 15.9 g/dL   Hematocrit 57.8 46.9 - 46.6 %   MCV 96 79 - 97 fL   MCH 31.3 26.6 - 33.0 pg   MCHC 32.7 31.5 - 35.7 g/dL   RDW 62.9 52.8 - 41.3 %   Platelets 160 150 - 450 x10E3/uL   Neutrophils 57 Not Estab. %   Lymphs 29 Not Estab. %   Monocytes 11 Not Estab. %   Eos 2 Not Estab. %   Basos 1 Not Estab. %   Neutrophils Absolute 3.6 1.4 - 7.0 x10E3/uL   Lymphocytes Absolute 1.8 0.7 - 3.1 x10E3/uL   Monocytes Absolute 0.7 0.1 - 0.9 x10E3/uL   EOS (ABSOLUTE) 0.1 0.0 - 0.4 x10E3/uL   Basophils Absolute 0.1 0.0 - 0.2 x10E3/uL   Immature Granulocytes 0 Not Estab. %   Immature Grans (Abs) 0.0 0.0 - 0.1 x10E3/uL  Assessment & Plan:  Augmentin Tessalon pearls Flonase Increase water intake Rest  Problem List Items Addressed This Visit       Respiratory   Acute URI - Primary    Return if symptoms worsen or fail to improve.   Total time spent: 25 minutes  Google, NP  05/31/2023   This document may have been prepared by Dragon Voice Recognition software and as such may include unintentional dictation errors.

## 2023-08-04 ENCOUNTER — Other Ambulatory Visit: Payer: Self-pay | Admitting: Internal Medicine

## 2023-08-04 DIAGNOSIS — I10 Essential (primary) hypertension: Secondary | ICD-10-CM

## 2023-08-28 ENCOUNTER — Other Ambulatory Visit: Payer: Self-pay | Admitting: Internal Medicine

## 2023-08-28 DIAGNOSIS — E782 Mixed hyperlipidemia: Secondary | ICD-10-CM

## 2023-08-28 DIAGNOSIS — I10 Essential (primary) hypertension: Secondary | ICD-10-CM

## 2023-09-03 ENCOUNTER — Ambulatory Visit: Payer: Self-pay | Admitting: Internal Medicine

## 2023-09-03 ENCOUNTER — Ambulatory Visit (INDEPENDENT_AMBULATORY_CARE_PROVIDER_SITE_OTHER): Admitting: Internal Medicine

## 2023-09-03 ENCOUNTER — Encounter: Payer: Self-pay | Admitting: Internal Medicine

## 2023-09-03 DIAGNOSIS — N390 Urinary tract infection, site not specified: Secondary | ICD-10-CM | POA: Diagnosis not present

## 2023-09-03 DIAGNOSIS — R350 Frequency of micturition: Secondary | ICD-10-CM

## 2023-09-03 LAB — POCT URINALYSIS DIPSTICK
Bilirubin, UA: NEGATIVE
Blood, UA: POSITIVE
Glucose, UA: POSITIVE — AB
Ketones, UA: NEGATIVE
Nitrite, UA: NEGATIVE
Protein, UA: POSITIVE — AB
Spec Grav, UA: 1.01
Urobilinogen, UA: 0.2 U/dL
pH, UA: 7

## 2023-09-03 MED ORDER — NITROFURANTOIN MONOHYD MACRO 100 MG PO CAPS: 100.0000 mg | ORAL_CAPSULE | Freq: Two times a day (BID) | ORAL | 0 refills | Status: AC

## 2023-09-03 NOTE — Progress Notes (Signed)
 UTI Check c/s- empirically start po abx-

## 2023-09-05 LAB — URINE CULTURE

## 2023-09-06 ENCOUNTER — Encounter: Payer: Self-pay | Admitting: Internal Medicine

## 2023-09-06 ENCOUNTER — Ambulatory Visit (INDEPENDENT_AMBULATORY_CARE_PROVIDER_SITE_OTHER): Admitting: Internal Medicine

## 2023-09-06 VITALS — BP 130/82 | HR 61 | Ht 66.0 in | Wt 172.8 lb

## 2023-09-06 DIAGNOSIS — Z1231 Encounter for screening mammogram for malignant neoplasm of breast: Secondary | ICD-10-CM

## 2023-09-06 DIAGNOSIS — E782 Mixed hyperlipidemia: Secondary | ICD-10-CM | POA: Diagnosis not present

## 2023-09-06 DIAGNOSIS — I10 Essential (primary) hypertension: Secondary | ICD-10-CM | POA: Diagnosis not present

## 2023-09-06 DIAGNOSIS — R7309 Other abnormal glucose: Secondary | ICD-10-CM

## 2023-09-06 DIAGNOSIS — J301 Allergic rhinitis due to pollen: Secondary | ICD-10-CM | POA: Diagnosis not present

## 2023-09-06 MED ORDER — FLUTICASONE PROPIONATE 50 MCG/ACT NA SUSP: 1.0000 | Freq: Every day | NASAL | 6 refills | Status: DC

## 2023-09-06 MED ORDER — LEVOCETIRIZINE DIHYDROCHLORIDE 5 MG PO TABS: 5.0000 mg | ORAL_TABLET | Freq: Every evening | ORAL | 3 refills | Status: AC

## 2023-09-06 NOTE — Progress Notes (Signed)
 Established Patient Office Visit  Subjective:  Patient ID: [name], female    DOB: Apr 23, 1940  Age: 83 y.o. MRN: [id number]  Chief Complaint  Patient presents with   Follow-up    4 month follow up    Patient comes in for her follow-up today.  Recently came in with symptoms of urinary tract infection, treatment was started with oral Macrobid.  The culture showed E. coli which was sensitive to Macrobid.  Patient is already feeling better although she still has few more doses left.  Will repeat her urine culture once antibiotic course is completed.  Denies any other complaints.  No chest pain, no shortness of breath and no palpitations.  Initial blood pressure was high but repeat looks better.  Patient has not taken her medications yet.  Patient will have fasting lab work done.  Also needs to schedule a mammogram.    No other concerns at this time.   Past Medical History:  Diagnosis Date   Actinic keratosis 12/19/2018   Right lat. antecubital area. Hypertrophic.   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 Excised: 07/10/2017    Past Surgical History:  Procedure Laterality Date   COLONOSCOPY N/A 08/06/2014   Procedure: COLONOSCOPY;  Surgeon: Cassie Click, MD;  Location: [facility] ENDOSCOPY;  Service: Endoscopy;  Laterality: N/A;   IR NEPHROSTOMY PLACEMENT LEFT  09/24/2017   LAPAROSCOPIC NEPHRECTOMY, HAND ASSISTED Left 11/05/2017   Procedure: HAND ASSISTED LAPAROSCOPIC NEPHRECTOMY;  Surgeon: Dustin Gimenez, MD;  Location: [facility] ORS;  Service: Urology;  Laterality: Left;   NEPHROSTOMY TUBE REMOVAL Left 11/05/2017   Procedure: NEPHROSTOMY TUBE REMOVAL;  Surgeon: Dustin Gimenez, MD;  Location: [facility] ORS;  Service: Urology;  Laterality: Left;   SHOULDER ARTHROSCOPY Right  2014   TONSILLECTOMY     TUBAL LIGATION      Social History   Socioeconomic History   Marital status: Married    Spouse name: Not on file   Number of children: Not on file   Years of education: Not on file   Highest education level: Not on file  Occupational History   Not on file  Tobacco Use   Smoking status: Former    Current packs/day: 0.00    Types: Cigarettes    Quit date: 03/28/1975    Years since quitting: 48.4   Smokeless tobacco: Never  Vaping Use   Vaping status: Never Used  Substance and Sexual Activity   Alcohol use: Yes    Alcohol/week: 7.0 standard drinks of alcohol    Types: 7 Glasses of wine per week   Drug use: No   Sexual activity: Not Currently  Other Topics Concern   Not on file  Social History Narrative   Not on file   Social Drivers of Health   Financial Resource Strain: Not on file  Food Insecurity: Not on file  Transportation Needs: Not on file  Physical Activity: Not on file  Stress: Not on file  Social Connections: Not on file  Intimate Partner Violence: Not on file    History reviewed. No pertinent family history.  Allergies  Allergen Reactions   Amlodipine Swelling   Adhesive [Tape] Other (See Comments)    Skin blisters if left  on too long. Paper tape is okay   Raloxifene Other (See Comments)    (evista) Hot flashes    Outpatient Medications Prior to Visit  Medication Sig   acetaminophen  (TYLENOL ) 500 MG tablet Take 1,000 mg by mouth every 6 (six) hours as needed (for pain.).   chlorthalidone (HYGROTON) 25 MG tablet TAKE 1 TABLET BY MOUTH EVERY DAY   cholecalciferol (VITAMIN D) 1000 UNITS tablet Take 1,000 Units by mouth at bedtime.    dapagliflozin  propanediol (FARXIGA ) 10 MG TABS tablet Take 1 tablet (10 mg total) by mouth daily before breakfast.   metoprolol  succinate (TOPROL -XL) 100 MG 24 hr tablet TAKE 1 TABLET BY MOUTH EVERY DAY   nitrofurantoin, macrocrystal-monohydrate, (MACROBID) 100 MG capsule Take 1 capsule (100 mg  total) by mouth 2 (two) times daily for 7 days.   rosuvastatin (CRESTOR) 20 MG tablet TAKE 1 TABLET BY MOUTH EVERY DAY   valsartan (DIOVAN) 160 MG tablet TAKE 1 TABLET BY MOUTH EVERY DAY   [DISCONTINUED] fluticasone  (FLONASE ) 50 MCG/ACT nasal spray Place 1 spray into both nostrils daily.   [DISCONTINUED] levocetirizine (XYZAL) 5 MG tablet Take 5 mg by mouth every evening.   Azelastine HCl 137 MCG/SPRAY SOLN 1-2 puffs in each nostril Nasally Twice a day for 30 day(s) (Patient not taking: Reported on 09/06/2023)   benzonatate  (TESSALON  PERLES) 100 MG capsule Take 1 capsule (100 mg total) by mouth 3 (three) times daily as needed for cough. (Patient not taking: Reported on 09/06/2023)   No facility-administered medications prior to visit.    Review of Systems  Constitutional: Negative.  Negative for chills, fever, malaise/fatigue and weight loss.  HENT: Negative.  Negative for sore throat.   Eyes: Negative.   Respiratory: Negative.  Negative for cough and shortness of breath.   Cardiovascular: Negative.  Negative for chest pain, palpitations and leg swelling.  Gastrointestinal: Negative.  Negative for abdominal pain, constipation, diarrhea, heartburn, nausea and vomiting.  Genitourinary: Negative.  Negative for dysuria and flank pain.  Musculoskeletal: Negative.  Negative for joint pain and myalgias.  Skin: Negative.   Neurological: Negative.  Negative for dizziness, tingling, tremors and headaches.  Endo/Heme/Allergies: Negative.   Psychiatric/Behavioral: Negative.  Negative for depression and suicidal ideas. The patient is not nervous/anxious.        Objective:   BP 130/82   Pulse 61   Ht 5' 6 (1.676 m)   Wt 172 lb 12.8 oz (78.4 kg)   SpO2 99%   BMI 27.89 kg/m   Vitals:   09/06/23 0924 09/06/23 0947  BP: (!) 154/84 130/82  Pulse: 61   Height: 5' 6 (1.676 m)   Weight: 172 lb 12.8 oz (78.4 kg)   SpO2: 99%   BMI (Calculated): 27.9     Physical Exam Vitals and nursing note  reviewed.  Constitutional:      Appearance: Normal appearance.  HENT:     Head: Normocephalic and atraumatic.     Nose: Nose normal.     Mouth/Throat:     Mouth: Mucous membranes are moist.     Pharynx: Oropharynx is clear.   Eyes:     Conjunctiva/sclera: Conjunctivae normal.     Pupils: Pupils are equal, round, and reactive to light.    Cardiovascular:     Rate and Rhythm: Normal rate and regular rhythm.     Pulses: Normal pulses.     Heart sounds: Normal heart sounds. No murmur heard. Pulmonary:     Effort: Pulmonary effort is normal.  Breath sounds: Normal breath sounds. No wheezing.  Abdominal:     General: Bowel sounds are normal.     Palpations: Abdomen is soft.     Tenderness: There is no abdominal tenderness. There is no right CVA tenderness or left CVA tenderness.   Musculoskeletal:        General: Normal range of motion.     Cervical back: Normal range of motion.     Right lower leg: No edema.     Left lower leg: No edema.   Skin:    General: Skin is warm and dry.   Neurological:     General: No focal deficit present.     Mental Status: She is alert and oriented to person, place, and time.   Psychiatric:        Mood and Affect: Mood normal.        Behavior: Behavior normal.      No results found for any visits on 09/06/23.  Recent Results (from the past 2160 hours)  POCT Urinalysis Dipstick     Status: Abnormal   Collection Time: 09/03/23 10:28 AM  Result Value Ref Range   Color, UA Yellow    Clarity, UA Clear    Glucose, UA Positive (A) Negative   Bilirubin, UA Negative    Ketones, UA Negative    Spec Grav, UA 1.010 1.010 - 1.025   Blood, UA Positive    pH, UA 7.0 5.0 - 8.0   Protein, UA Positive (A) Negative   Urobilinogen, UA 0.2 0.2 or 1.0 E.U./dL   Nitrite, UA Negative    Leukocytes, UA Large (3+) (A) Negative   Appearance Clear    Odor Yes   Urine Culture     Status: Abnormal   Collection Time: 09/03/23  1:10 PM   Specimen: Urine    UR  Result Value Ref Range   Urine Culture, Routine Final report (A)    Organism ID, Bacteria Escherichia coli (A)     Comment: Multi-Drug Resistant Organism Greater than 100,000 colony forming units per mL    Antimicrobial Susceptibility Comment     Comment:       ** S = Susceptible; I = Intermediate; R = Resistant **                    P = Positive; N = Negative             MICS are expressed in micrograms per mL    Antibiotic                 RSLT#1    RSLT#2    RSLT#3    RSLT#4 Amoxicillin /Clavulanic Acid    I Ampicillin                     R Cefazolin                       R Cefepime                       S Cefoxitin                      R Cefpodoxime                    S Ceftriaxone                    S Ciprofloxacin   S Ertapenem                      S Gentamicin                     S Levofloxacin                   S Meropenem                      S Nitrofurantoin                 S Piperacillin/Tazobactam        S Tetracycline                   S Tobramycin                     S Trimethoprim /Sulfa              S       Assessment & Plan:  Continue current medications.  Fasting labs today. Problem List Items Addressed This Visit     Allergic rhinitis due to pollen   Relevant Medications   levocetirizine (XYZAL) 5 MG tablet   fluticasone  (FLONASE ) 50 MCG/ACT nasal spray   HLD (hyperlipidemia)   Relevant Orders   Lipid Panel w/o Chol/HDL Ratio   Essential hypertension, benign - Primary   Relevant Orders   CMP14+EGFR   Elevated glucose   Relevant Orders   Hemoglobin A1c   Other Visit Diagnoses       Breast cancer screening by mammogram       Relevant Orders   MM 3D SCREENING MAMMOGRAM BILATERAL BREAST       Follow up 4 months.  Total time spent: 30 minutes  Aisha Hove, MD  09/06/2023   This document may have been prepared by [facility] Voice Recognition software and as such may include unintentional dictation errors.

## 2023-09-06 NOTE — Progress Notes (Signed)
 Patient notified

## 2023-09-07 ENCOUNTER — Ambulatory Visit: Payer: HMO | Admitting: Internal Medicine

## 2023-09-07 LAB — LIPID PANEL W/O CHOL/HDL RATIO
Cholesterol, Total: 141 mg/dL (ref 100–199)
HDL: 67 mg/dL (ref >39)
LDL Chol Calc (NIH): 51 mg/dL (ref 0–99)
Triglycerides: 138 mg/dL (ref 0–149)
VLDL Cholesterol Cal: 23 mg/dL (ref 5–40)

## 2023-09-07 LAB — CMP14+EGFR
ALT: 58 IU/L — ABNORMAL HIGH (ref 0–32)
AST: 66 IU/L — ABNORMAL HIGH (ref 0–40)
Albumin: 4.4 g/dL (ref 3.7–4.7)
Alkaline Phosphatase: 86 IU/L (ref 44–121)
BUN/Creatinine Ratio: 26 (ref 12–28)
BUN: 30 mg/dL — ABNORMAL HIGH (ref 8–27)
Bilirubin Total: 0.6 mg/dL (ref 0.0–1.2)
CO2: 23 mmol/L (ref 20–29)
Calcium: 9 mg/dL (ref 8.7–10.3)
Chloride: 102 mmol/L (ref 96–106)
Creatinine, Ser: 1.15 mg/dL — ABNORMAL HIGH (ref 0.57–1.00)
Globulin, Total: 2.3 g/dL (ref 1.5–4.5)
Glucose: 107 mg/dL — ABNORMAL HIGH (ref 70–99)
Potassium: 4 mmol/L (ref 3.5–5.2)
Sodium: 142 mmol/L (ref 134–144)
Total Protein: 6.7 g/dL (ref 6.0–8.5)
eGFR: 48 mL/min/{1.73_m2} — ABNORMAL LOW (ref >59)

## 2023-09-07 LAB — HEMOGLOBIN A1C
Est. average glucose Bld gHb Est-mCnc: 111 mg/dL
Hgb A1c MFr Bld: 5.5 % (ref 4.8–5.6)

## 2023-09-10 ENCOUNTER — Ambulatory Visit: Payer: Self-pay | Admitting: Internal Medicine

## 2023-09-10 DIAGNOSIS — R748 Abnormal levels of other serum enzymes: Secondary | ICD-10-CM

## 2023-09-13 ENCOUNTER — Encounter: Payer: Self-pay | Admitting: Cardiovascular Disease

## 2023-09-13 ENCOUNTER — Ambulatory Visit: Payer: HMO | Admitting: Cardiovascular Disease

## 2023-09-13 VITALS — BP 121/71 | HR 62 | Ht 66.0 in | Wt 171.0 lb

## 2023-09-13 DIAGNOSIS — E782 Mixed hyperlipidemia: Secondary | ICD-10-CM | POA: Diagnosis not present

## 2023-09-13 DIAGNOSIS — I10 Essential (primary) hypertension: Secondary | ICD-10-CM

## 2023-09-13 DIAGNOSIS — I340 Nonrheumatic mitral (valve) insufficiency: Secondary | ICD-10-CM

## 2023-09-13 DIAGNOSIS — I5032 Chronic diastolic (congestive) heart failure: Secondary | ICD-10-CM

## 2023-09-13 DIAGNOSIS — R0602 Shortness of breath: Secondary | ICD-10-CM | POA: Diagnosis not present

## 2023-09-13 NOTE — Progress Notes (Signed)
 Cardiology Office Note   Date:  09/13/2023   ID:  [name], DOB [birth year], MRN [id number]  PCP:  Aisha Hove, MD  Cardiologist:  Debborah Fairly, MD      History of Present Illness: [name] is a 83 y.o. female who presents for No chief complaint on file.   Feeling better after farxiga .      Past Medical History:  Diagnosis Date   Actinic keratosis 12/19/2018   Right lat. antecubital area. Hypertrophic.   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 Excised: 07/10/2017     Past Surgical History:  Procedure Laterality Date   COLONOSCOPY N/A 08/06/2014   Procedure: COLONOSCOPY;  Surgeon: Cassie Click, MD;  Location: [facility] ENDOSCOPY;  Service: Endoscopy;  Laterality: N/A;   IR NEPHROSTOMY PLACEMENT LEFT  09/24/2017   LAPAROSCOPIC NEPHRECTOMY, HAND ASSISTED Left 11/05/2017   Procedure: HAND ASSISTED LAPAROSCOPIC NEPHRECTOMY;  Surgeon: Dustin Gimenez, MD;  Location: [facility] ORS;  Service: Urology;  Laterality: Left;   NEPHROSTOMY TUBE REMOVAL Left 11/05/2017   Procedure: NEPHROSTOMY TUBE REMOVAL;  Surgeon: Dustin Gimenez, MD;  Location: [facility] ORS;  Service: Urology;  Laterality: Left;   SHOULDER ARTHROSCOPY Right 2014   TONSILLECTOMY     TUBAL LIGATION       Current Outpatient Medications  Medication Sig Dispense Refill   acetaminophen  (TYLENOL ) 500 MG tablet Take 1,000 mg by mouth every 6 (six) hours as needed (for pain.).     Azelastine HCl 137 MCG/SPRAY SOLN 1-2 puffs in each nostril Nasally Twice a day for 30 day(s)     benzonatate  (TESSALON  PERLES) 100 MG capsule Take 1 capsule (100 mg total) by mouth 3 (three) times daily as needed for cough. 30 capsule 1   chlorthalidone (HYGROTON) 25 MG tablet TAKE 1 TABLET BY MOUTH EVERY DAY 90 tablet 1    cholecalciferol (VITAMIN D) 1000 UNITS tablet Take 1,000 Units by mouth at bedtime.      dapagliflozin  propanediol (FARXIGA ) 10 MG TABS tablet Take 1 tablet (10 mg total) by mouth daily before breakfast. 90 tablet 3   fluticasone  (FLONASE ) 50 MCG/ACT nasal spray Place 1 spray into both nostrils daily. 15.8 mL 6   levocetirizine (XYZAL ) 5 MG tablet Take 1 tablet (5 mg total) by mouth every evening. 90 tablet 3   metoprolol  succinate (TOPROL -XL) 100 MG 24 hr tablet TAKE 1 TABLET BY MOUTH EVERY DAY 90 tablet 3   rosuvastatin (CRESTOR) 20 MG tablet TAKE 1 TABLET BY MOUTH EVERY DAY 90 tablet 1   valsartan (DIOVAN) 160 MG tablet TAKE 1 TABLET BY MOUTH EVERY DAY 90 tablet 3   No current facility-administered medications for this visit.    Allergies:   Amlodipine, Adhesive [tape], and Raloxifene    Social History:   reports that she quit smoking about 48 years ago. Her smoking use included cigarettes. She has never used smokeless tobacco. She reports current alcohol use of about 7.0 standard drinks of alcohol per week. She reports that she does not use drugs.   Family History:  family history is not on file.    ROS:     Review of Systems  Constitutional: Negative.   HENT: Negative.  Eyes: Negative.   Respiratory: Negative.    Gastrointestinal: Negative.   Genitourinary: Negative.   Musculoskeletal: Negative.   Skin: Negative.   Neurological: Negative.   Endo/Heme/Allergies: Negative.   Psychiatric/Behavioral: Negative.    All other systems reviewed and are negative.     All other systems are reviewed and negative.    PHYSICAL EXAM: VS:  BP 121/71   Pulse 62   Ht 5' 6 (1.676 m)   Wt 171 lb (77.6 kg)   SpO2 96%   BMI 27.60 kg/m  , BMI Body mass index is 27.6 kg/m. Last weight:  Wt Readings from Last 3 Encounters:  09/13/23 171 lb (77.6 kg)  09/06/23 172 lb 12.8 oz (78.4 kg)  05/31/23 168 lb (76.2 kg)     Physical Exam Constitutional:      Appearance: Normal  appearance.   Cardiovascular:     Rate and Rhythm: Normal rate and regular rhythm.     Heart sounds: Normal heart sounds.  Pulmonary:     Effort: Pulmonary effort is normal.     Breath sounds: Normal breath sounds.   Musculoskeletal:     Right lower leg: No edema.     Left lower leg: No edema.   Neurological:     Mental Status: She is alert.       EKG:   Recent Labs: 05/11/2023: Hemoglobin 14.1; Platelets 160 09/06/2023: ALT 58; BUN 30; Creatinine, Ser 1.15; Potassium 4.0; Sodium 142    Lipid Panel    Component Value Date/Time   CHOL 141 09/06/2023 0958   TRIG 138 09/06/2023 0958   HDL 67 09/06/2023 0958   LDLCALC 51 09/06/2023 0958      Other studies Reviewed: Additional studies/ records that were reviewed today include:  Review of the above records demonstrates:       No data to display            ASSESSMENT AND PLAN:    ICD-10-CM   1. Chronic heart failure with preserved ejection fraction (HCC)  I50.32    feeling better , no sOB on farxiga     2. Mixed hyperlipidemia  E78.2     3. Essential hypertension, benign  I10     4. SOB (shortness of breath)  R06.02     5. 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    Total time spent: 30 minutes  Signed,  Debborah Fairly, MD  09/13/2023 11:18 AM    [facility]

## 2023-09-13 NOTE — Progress Notes (Signed)
 Patient notified

## 2023-09-17 ENCOUNTER — Ambulatory Visit: Payer: Self-pay | Admitting: Internal Medicine

## 2023-09-17 ENCOUNTER — Ambulatory Visit (INDEPENDENT_AMBULATORY_CARE_PROVIDER_SITE_OTHER): Admitting: Internal Medicine

## 2023-09-17 ENCOUNTER — Ambulatory Visit
Admission: RE | Admit: 2023-09-17 | Discharge: 2023-09-17 | Disposition: A | Discharge status: Home / Self Care | Payer: Self-pay | Source: Ambulatory Visit | Attending: Internal Medicine | Admitting: Internal Medicine

## 2023-09-17 ENCOUNTER — Encounter: Payer: Self-pay | Admitting: Internal Medicine

## 2023-09-17 DIAGNOSIS — Z905 Acquired absence of kidney: Secondary | ICD-10-CM | POA: Diagnosis not present

## 2023-09-17 DIAGNOSIS — R350 Frequency of micturition: Secondary | ICD-10-CM

## 2023-09-17 DIAGNOSIS — R748 Abnormal levels of other serum enzymes: Secondary | ICD-10-CM | POA: Insufficient documentation

## 2023-09-17 LAB — POCT URINALYSIS DIPSTICK
Bilirubin, UA: NEGATIVE
Glucose, UA: POSITIVE — AB
Ketones, UA: NEGATIVE
Leukocytes, UA: NEGATIVE
Nitrite, UA: POSITIVE
Protein, UA: NEGATIVE
Spec Grav, UA: 1.02 (ref 1.010–1.025)
Urobilinogen, UA: 0.2 U/dL
pH, UA: 5.5 (ref 5.0–8.0)

## 2023-09-17 MED ORDER — NITROFURANTOIN MONOHYD MACRO 100 MG PO CAPS: 100.0000 mg | ORAL_CAPSULE | Freq: Two times a day (BID) | ORAL | 0 refills | Status: AC

## 2023-09-17 NOTE — Progress Notes (Signed)
 Nitrite is Positive- but no WBC Her glucose is positive -but recent Hgba1c was normal- Will send to u/a and c/s Rx sent for 3 days of po Macrobid 

## 2023-09-17 NOTE — Progress Notes (Signed)
 Check u/a,c/s Macrobid  for 3 days.

## 2023-09-18 LAB — URINALYSIS
Bilirubin, UA: NEGATIVE
Ketones, UA: NEGATIVE
Nitrite, UA: NEGATIVE
Protein,UA: NEGATIVE
RBC, UA: NEGATIVE
Specific Gravity, UA: 1.018 (ref 1.005–1.030)
Urobilinogen, Ur: 0.2 mg/dL (ref 0.2–1.0)
pH, UA: 5 (ref 5.0–7.5)

## 2023-09-18 NOTE — Progress Notes (Signed)
 Patient notified

## 2023-09-20 ENCOUNTER — Ambulatory Visit: Payer: Self-pay | Admitting: Internal Medicine

## 2023-09-20 NOTE — Progress Notes (Signed)
 Patient notified

## 2023-09-22 DIAGNOSIS — J069 Acute upper respiratory infection, unspecified: Secondary | ICD-10-CM | POA: Diagnosis not present

## 2023-09-22 DIAGNOSIS — Z03818 Encounter for observation for suspected exposure to other biological agents ruled out: Secondary | ICD-10-CM | POA: Diagnosis not present

## 2023-10-11 ENCOUNTER — Ambulatory Visit: Admitting: Dermatology

## 2023-10-11 DIAGNOSIS — Z79899 Other long term (current) drug therapy: Secondary | ICD-10-CM

## 2023-10-11 DIAGNOSIS — L280 Lichen simplex chronicus: Secondary | ICD-10-CM

## 2023-10-11 DIAGNOSIS — Z7189 Other specified counseling: Secondary | ICD-10-CM | POA: Diagnosis not present

## 2023-10-11 DIAGNOSIS — L821 Other seborrheic keratosis: Secondary | ICD-10-CM

## 2023-10-11 DIAGNOSIS — L820 Inflamed seborrheic keratosis: Secondary | ICD-10-CM

## 2023-10-11 DIAGNOSIS — L281 Prurigo nodularis: Secondary | ICD-10-CM

## 2023-10-11 MED ORDER — MOMETASONE FUROATE 0.1 % EX CREA: TOPICAL_CREAM | CUTANEOUS | 1 refills | Status: AC

## 2023-10-11 NOTE — Progress Notes (Signed)
 Follow-Up Visit   Subjective  [name] is a 83 y.o. female who presents for the following: Lesion on the L leg treated several times with LN2, but remains persistent, pt does pick at it, and she would like it removed today. She has a similar lesion on the R upper arm that is bothersome.  The following portions of the chart were reviewed this encounter and updated as appropriate: medications, allergies, medical history  Review of Systems:  No other skin or systemic complaints except as noted in HPI or Assessment and Plan.  Objective  Well appearing patient in no apparent distress; mood and affect are within normal limits.   A focused examination was performed of the following areas: the face, arms, and legs   Relevant exam findings are noted in the Assessment and Plan.  L lat thigh x 1, R upper arm x 1 (2) Erythematous stuck-on, waxy papule or plaque  Assessment & Plan   SEBORRHEIC KERATOSIS - Stuck-on, waxy, tan-brown papules and/or plaques  - Benign-appearing - Discussed benign etiology and prognosis. - Observe - Call for any changes INFLAMED SEBORRHEIC KERATOSIS (2) L lat thigh x 1, R upper arm x 1 (2) With LSC - LICHEN SIMPLEX CHRONICUS/PRURIGO NODULARIS - Symptomatic, irritating, patient would like treated.  Exam: Excoriated lichenified papules and/or plaques at the R upper arm and L lat thigh  Chronic and persistent condition with duration or expected duration over one year. Condition is symptomatic/ bothersome to patient. Not currently at goal.  Lichen simplex chronicus [name]) is a persistent itchy area of thickened skin that is induced by chronic rubbing and/or scratching (chronic dermatitis).  These areas may be pink, hyperpigmented and may have excoriations and bumps (prurigo nodules- PN).  LSC/PN is commonly observed in uncontrolled atopic dermatitis and other forms of eczema, and in other itchy skin conditions (eg, insect bites, scabies).  Sometimes it is not  possible to know initial cause of LSC/PN if it has been present for a long time.  It generally responds well to treatment with high potency topical steroids.  It is important to stop rubbing/scratching the area in order to break the itch-scratch-rash-itch cycle, in order for the rash to resolve.   Treatment Plan: Avoid picking/rubbing/scratching. Start Mometasone  0.1% cream to aa's BID PRN.Topical steroids (such as triamcinolone , fluocinolone, fluocinonide, mometasone , clobetasol, halobetasol, betamethasone, hydrocortisone) can cause thinning and lightening of the skin if they are used for too long in the same area. Your physician has selected the right strength medicine for your problem and area affected on the body. Please use your medication only as directed by your physician to prevent side effects.   Destruction of lesion - L lat thigh x 1, R upper arm x 1 (2) Complexity: simple   Destruction method: cryotherapy   Informed consent: discussed and consent obtained   Timeout:  patient name, date of birth, surgical site, and procedure verified Lesion destroyed using liquid nitrogen: Yes   Region frozen until ice ball extended beyond lesion: Yes   Outcome: patient tolerated procedure well with no complications   Post-procedure details: wound care instructions given    Intralesional injection - L lat thigh x 1, R upper arm x 1 (2) Location: R upper arm, L lat thigh  Informed Consent: Discussed risks (infection, pain, bleeding, bruising, thinning of the skin, loss of skin pigment, lack of resolution, and recurrence of lesion) and benefits of the procedure, as well as the alternatives. Informed consent was obtained. Preparation: The area  was prepared a standard fashion.  Procedure Details: An intralesional injection was performed with Kenalog 5 mg/cc. 0.1 cc in total were injected.  Total number of injections: 2  Plan: The patient was instructed on post-op care. Recommend OTC analgesia as  needed for pain.  NDC 9996-9505-79 Lot# [id number] Exp date: 08/2025   mometasone  (ELOCON ) 0.1 % cream - L lat thigh x 1, R upper arm x 1 (2) Apply to lesions on the R upper arm, and L thigh BID PRN.  Return for appointment as scheduled.  [name], CMA, am acting as scribe for Alm Rhyme, MD .   Documentation: I have reviewed the above documentation for accuracy and completeness, and I agree with the above.  Alm Rhyme, MD

## 2023-10-11 NOTE — Patient Instructions (Signed)

## 2023-10-16 ENCOUNTER — Encounter: Payer: Self-pay | Admitting: Dermatology

## 2023-11-13 NOTE — Progress Notes (Signed)
   11/13/2023  Patient ID: [name], female   DOB: 1940-05-10, 83 y.o.   MRN: [id number]  Pharmacy Quality Measure Review  This patient is appearing on a report for being at risk of failing the adherence measure for cholesterol (statin), diabetes, and hypertension (ACEi/ARB) medications this calendar year.   Medication: Rosuvastatin Last fill date: 08/28/23 for 90 day supply  Medication: Valsartan Last fill date: 08/28/23 for 90 day supply  Medication: Farxiga  Last fill date: 08/21/23 for 90 day supply  Insurance report was not up to date. No action needed at this time.   Jon VEAR Lindau, PharmD Clinical Pharmacist [phone]

## 2023-11-14 DIAGNOSIS — Z1231 Encounter for screening mammogram for malignant neoplasm of breast: Secondary | ICD-10-CM | POA: Diagnosis not present

## 2023-12-04 NOTE — Progress Notes (Signed)
   12/04/2023  Patient ID: [name], female   DOB: [birth year], 83 y.o.   MRN: [id number]  Pharmacy Quality Measure Review  This patient is appearing on a report for being at risk of failing the adherence measure for cholesterol (statin), diabetes, and hypertension (ACEi/ARB) medications this calendar year.   Medication: Rosuvastatin Last fill date: 11/26/23 for 90 day supply  Medication: Valsartan Last fill date: 11/26/23 for 90 day supply  Medication: Farxiga  Last fill date: 11/19/23 for 90 day supply  Insurance report was not up to date. No action needed at this time.   Jon VEAR Lindau, PharmD Clinical Pharmacist [phone]

## 2023-12-14 ENCOUNTER — Ambulatory Visit (INDEPENDENT_AMBULATORY_CARE_PROVIDER_SITE_OTHER): Admitting: Internal Medicine

## 2023-12-14 ENCOUNTER — Encounter: Payer: Self-pay | Admitting: Internal Medicine

## 2023-12-14 VITALS — BP 134/86 | HR 55 | Ht 66.0 in | Wt 171.4 lb

## 2023-12-14 DIAGNOSIS — I10 Essential (primary) hypertension: Secondary | ICD-10-CM | POA: Diagnosis not present

## 2023-12-14 DIAGNOSIS — J301 Allergic rhinitis due to pollen: Secondary | ICD-10-CM | POA: Diagnosis not present

## 2023-12-14 DIAGNOSIS — M8580 Other specified disorders of bone density and structure, unspecified site: Secondary | ICD-10-CM

## 2023-12-14 DIAGNOSIS — R748 Abnormal levels of other serum enzymes: Secondary | ICD-10-CM | POA: Diagnosis not present

## 2023-12-14 DIAGNOSIS — R7303 Prediabetes: Secondary | ICD-10-CM

## 2023-12-14 DIAGNOSIS — I340 Nonrheumatic mitral (valve) insufficiency: Secondary | ICD-10-CM | POA: Diagnosis not present

## 2023-12-14 DIAGNOSIS — E782 Mixed hyperlipidemia: Secondary | ICD-10-CM | POA: Diagnosis not present

## 2023-12-14 DIAGNOSIS — Z780 Asymptomatic menopausal state: Secondary | ICD-10-CM | POA: Diagnosis not present

## 2023-12-14 DIAGNOSIS — R7309 Other abnormal glucose: Secondary | ICD-10-CM

## 2023-12-14 DIAGNOSIS — I5032 Chronic diastolic (congestive) heart failure: Secondary | ICD-10-CM | POA: Diagnosis not present

## 2023-12-14 NOTE — Progress Notes (Signed)
 Established Patient Office Visit  Subjective:  Patient ID: [name], female    DOB: April 08, 1940  Age: 83 y.o. MRN: [id number]  Chief Complaint  Patient presents with   Follow-up    3 month follow up    Patient is here today to follow up. She reports feeling well and having no complaints at this time. She denies any headache, chest pain, shortness of breath, abdominal pain, nausea, vomiting/diarrhea at this time. She is up to date on her mammogram, completed 10/2023. Last colonoscopy 2016; never had abnormal findings or any polyps. Will order cologuard in 1 year when she is due for her colon cancer screening. Patient is due for DEXA scan; will order. Patient is due today for routine fasting blood work and will collect today. Patient has had previous elevated liver enzymes; she reports drinking 1 glass of wine rarely and endorses she used to take tylenol  but has since stopped since she was having elevated liver enzymes.  Discussed annual vaccinations and recommended patient get flu and RSV, covid. Patient is hesitant on covid vaccine but will be interested in flu, RSV.    No other concerns at this time.   Past Medical History:  Diagnosis Date   Actinic keratosis 12/19/2018   Right lat. antecubital area. Hypertrophic.   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 Excised: 07/10/2017    Past Surgical History:  Procedure Laterality Date   COLONOSCOPY N/A 08/06/2014   Procedure: COLONOSCOPY;  Surgeon: Lamar ONEIDA Holmes, MD;  Location: [facility] ENDOSCOPY;  Service: Endoscopy;  Laterality: N/A;   IR NEPHROSTOMY PLACEMENT LEFT  09/24/2017   LAPAROSCOPIC NEPHRECTOMY, HAND ASSISTED Left 11/05/2017   Procedure: HAND ASSISTED LAPAROSCOPIC NEPHRECTOMY;  Surgeon: Penne Knee, MD;   Location: [facility] ORS;  Service: Urology;  Laterality: Left;   NEPHROSTOMY TUBE REMOVAL Left 11/05/2017   Procedure: NEPHROSTOMY TUBE REMOVAL;  Surgeon: Penne Knee, MD;  Location: [facility] ORS;  Service: Urology;  Laterality: Left;   SHOULDER ARTHROSCOPY Right 2014   TONSILLECTOMY     TUBAL LIGATION      Social History   Socioeconomic History   Marital status: Married    Spouse name: Not on file   Number of children: Not on file   Years of education: Not on file   Highest education level: Not on file  Occupational History   Not on file  Tobacco Use   Smoking status: Former    Current packs/day: 0.00    Types: Cigarettes    Quit date: 03/28/1975    Years since quitting: 48.7   Smokeless tobacco: Never  Vaping Use   Vaping status: Never Used  Substance and Sexual Activity   Alcohol use: Yes    Alcohol/week: 7.0 standard drinks of alcohol    Types: 7 Glasses of wine per week   Drug use: No   Sexual activity: Not Currently  Other Topics Concern   Not on file  Social History Narrative   Not on file   Social Drivers of Health   Financial Resource Strain: Low Risk  (09/22/2023)   Received from [facility]   Overall Financial Resource Strain (CARDIA)    Difficulty of Paying Living Expenses: Not hard at all  Food Insecurity: No Food Insecurity (09/22/2023)   Received from [facility]   Hunger Vital Sign    Within the past 12 months, you worried that your food would run out before you got the money to buy more.: Never true    Within the past 12 months, the food you bought just didn't last and you didn't have money to get more.: Never true  Transportation Needs: No Transportation Needs (09/22/2023)   Received from [facility] - Transportation    In the past 12 months, has lack of transportation kept you from medical appointments or from getting medications?: No    Lack of Transportation (Non-Medical): No  Physical  Activity: Not on file  Stress: Not on file  Social Connections: Not on file  Intimate Partner Violence: Not on file    History reviewed. No pertinent family history.  Allergies  Allergen Reactions   Amlodipine Swelling   Adhesive [Tape] Other (See Comments)    Skin blisters if left on too long. Paper tape is okay   Raloxifene Other (See Comments)    (evista) Hot flashes    Outpatient Medications Prior to Visit  Medication Sig   acetaminophen  (TYLENOL ) 500 MG tablet Take 1,000 mg by mouth every 6 (six) hours as needed (for pain.).   Azelastine HCl 137 MCG/SPRAY SOLN 1-2 puffs in each nostril Nasally Twice a day for 30 day(s)   chlorthalidone (HYGROTON) 25 MG tablet TAKE 1 TABLET BY MOUTH EVERY DAY   cholecalciferol (VITAMIN D) 1000 UNITS tablet Take 1,000 Units by mouth at bedtime.    dapagliflozin  propanediol (FARXIGA ) 10 MG TABS tablet Take 1 tablet (10 mg total) by mouth daily before breakfast.   levocetirizine (XYZAL ) 5 MG tablet Take 1 tablet (5 mg total) by mouth every evening.   metoprolol  succinate (TOPROL -XL) 100 MG 24 hr tablet TAKE 1 TABLET BY MOUTH EVERY DAY   mometasone  (ELOCON ) 0.1 % cream Apply to lesions on the R upper arm, and L thigh BID PRN.   rosuvastatin (CRESTOR) 20 MG tablet TAKE 1 TABLET BY MOUTH EVERY DAY   valsartan (DIOVAN) 160 MG tablet TAKE 1 TABLET BY MOUTH EVERY DAY   benzonatate  (TESSALON  PERLES) 100 MG capsule Take 1 capsule (100 mg total) by mouth 3 (three) times daily as needed for cough. (Patient not taking: Reported on 12/14/2023)   fluticasone  (FLONASE ) 50 MCG/ACT nasal spray Place 1 spray into both nostrils daily. (Patient not taking: Reported on 12/14/2023)   No facility-administered medications prior to visit.    Review of Systems  Constitutional: Negative.  Negative for chills, fever and malaise/fatigue.  HENT: Negative.  Negative for congestion and sore throat.   Eyes: Negative.  Negative for blurred vision and pain.  Respiratory:  Negative.  Negative for cough and shortness of breath.   Cardiovascular: Negative.  Negative for chest pain, palpitations and leg swelling.  Gastrointestinal: Negative.  Negative for abdominal pain, blood in stool, constipation, diarrhea, heartburn, melena, nausea and vomiting.  Genitourinary: Negative.  Negative for dysuria, flank pain, frequency and urgency.  Musculoskeletal: Negative.  Negative for joint pain and myalgias.  Skin: Negative.   Neurological: Negative.  Negative for dizziness, tingling, sensory change, weakness and headaches.  Endo/Heme/Allergies: Negative.   Psychiatric/Behavioral: Negative.  Negative for depression and suicidal ideas. The patient is not nervous/anxious.        Objective:   BP 134/86   Pulse (!) 55   Ht 5' 6 (1.676 m)  Wt 171 lb 6.4 oz (77.7 kg)   SpO2 95%   BMI 27.66 kg/m   Vitals:   12/14/23 0928  BP: 134/86  Pulse: (!) 55  Height: 5' 6 (1.676 m)  Weight: 171 lb 6.4 oz (77.7 kg)  SpO2: 95%  BMI (Calculated): 27.68    Physical Exam Vitals and nursing note reviewed.  Constitutional:      Appearance: Normal appearance.  HENT:     Head: Normocephalic and atraumatic.     Nose: Nose normal.     Mouth/Throat:     Mouth: Mucous membranes are moist.     Pharynx: Oropharynx is clear.  Eyes:     Conjunctiva/sclera: Conjunctivae normal.     Pupils: Pupils are equal, round, and reactive to light.  Cardiovascular:     Rate and Rhythm: Normal rate and regular rhythm.     Pulses: Normal pulses.     Heart sounds: Normal heart sounds. No murmur heard. Pulmonary:     Effort: Pulmonary effort is normal.     Breath sounds: Normal breath sounds. No wheezing.  Abdominal:     General: Bowel sounds are normal.     Palpations: Abdomen is soft.     Tenderness: There is no abdominal tenderness. There is no right CVA tenderness or left CVA tenderness.  Musculoskeletal:        General: Normal range of motion.     Cervical back: Normal range of  motion.     Right lower leg: No edema.     Left lower leg: No edema.  Skin:    General: Skin is warm and dry.  Neurological:     General: No focal deficit present.     Mental Status: She is alert and oriented to person, place, and time.  Psychiatric:        Mood and Affect: Mood normal.        Behavior: Behavior normal.      No results found for any visits on 12/14/23.  Recent Results (from the past 2160 hours)  POCT Urinalysis Dipstick (18997)     Status: Abnormal   Collection Time: 09/17/23 11:12 AM  Result Value Ref Range   Color, UA Light yellow    Clarity, UA Cloudy    Glucose, UA Positive (A) Negative   Bilirubin, UA Negative    Ketones, UA Negative    Spec Grav, UA 1.020 1.010 - 1.025   Blood, UA Trace    pH, UA 5.5 5.0 - 8.0   Protein, UA Negative Negative   Urobilinogen, UA 0.2 0.2 or 1.0 E.U./dL   Nitrite, UA Positive    Leukocytes, UA Negative Negative   Appearance Cloudy    Odor Yes   Urinalysis     Status: Abnormal   Collection Time: 09/17/23  3:00 PM  Result Value Ref Range   Specific Gravity, UA 1.018 1.005 - 1.030   pH, UA 5.0 5.0 - 7.5   Color, UA Yellow Yellow   Appearance Ur Cloudy (A) Clear   Leukocytes,UA 1+ (A) Negative   Protein,UA Negative Negative/Trace   Glucose, UA 3+ (A) Negative   Ketones, UA Negative Negative   RBC, UA Negative Negative   Bilirubin, UA Negative Negative   Urobilinogen, Ur 0.2 0.2 - 1.0 mg/dL   Nitrite, UA Negative Negative      Assessment & Plan:  Continue taking medications as prescribed. Will collect routine fasting labs today and FU with patient on results. Will order DEXA scan. Problem List  Items Addressed This Visit     Allergic rhinitis due to pollen   HLD (hyperlipidemia)   Relevant Orders   Lipid Panel w/o Chol/HDL Ratio   CBC with Diff   Essential hypertension, benign - Primary   Relevant Orders   CMP14+EGFR   CBC with Diff   Elevated glucose   Chronic heart failure with preserved ejection  fraction (HCC)   Osteopenia after menopause   Relevant Orders   HM DEXA SCAN (Completed)   Prediabetes   Relevant Orders   Hemoglobin A1c   Nonrheumatic mitral valve regurgitation   Elevated liver enzymes    Return in about 4 months (around 04/14/2024).   Total time spent: 30 minutes  FERNAND FREDY RAMAN, MD  12/14/2023   This document may have been prepared by [facility] Voice Recognition software and as such may include unintentional dictation errors.

## 2023-12-15 LAB — LIPID PANEL W/O CHOL/HDL RATIO
Cholesterol, Total: 147 mg/dL (ref 100–199)
HDL: 63 mg/dL (ref >39)
LDL Chol Calc (NIH): 54 mg/dL (ref 0–99)
Triglycerides: 188 mg/dL — ABNORMAL HIGH (ref 0–149)
VLDL Cholesterol Cal: 30 mg/dL (ref 5–40)

## 2023-12-15 LAB — CBC WITH DIFFERENTIAL/PLATELET
Basophils Absolute: 0.1 x10E3/uL (ref 0.0–0.2)
Basos: 1 %
EOS (ABSOLUTE): 0.1 x10E3/uL (ref 0.0–0.4)
Eos: 2 %
Hematocrit: 41 % (ref 34.0–46.6)
Hemoglobin: 13.2 g/dL (ref 11.1–15.9)
Immature Grans (Abs): 0 x10E3/uL (ref 0.0–0.1)
Immature Granulocytes: 0 %
Lymphocytes Absolute: 1.9 x10E3/uL (ref 0.7–3.1)
Lymphs: 33 %
MCH: 31.9 pg (ref 26.6–33.0)
MCHC: 32.2 g/dL (ref 31.5–35.7)
MCV: 99 fL — ABNORMAL HIGH (ref 79–97)
Monocytes Absolute: 0.6 x10E3/uL (ref 0.1–0.9)
Monocytes: 11 %
Neutrophils Absolute: 3 x10E3/uL (ref 1.4–7.0)
Neutrophils: 53 %
Platelets: 168 x10E3/uL (ref 150–450)
RBC: 4.14 x10E6/uL (ref 3.77–5.28)
RDW: 12.5 % (ref 11.7–15.4)
WBC: 5.6 x10E3/uL (ref 3.4–10.8)

## 2023-12-15 LAB — CMP14+EGFR
ALT: 28 IU/L (ref 0–32)
AST: 31 IU/L (ref 0–40)
Albumin: 4.4 g/dL (ref 3.7–4.7)
Alkaline Phosphatase: 68 IU/L (ref 48–129)
BUN/Creatinine Ratio: 22 (ref 12–28)
BUN: 28 mg/dL — ABNORMAL HIGH (ref 8–27)
Bilirubin Total: 0.6 mg/dL (ref 0.0–1.2)
CO2: 25 mmol/L (ref 20–29)
Calcium: 9.4 mg/dL (ref 8.7–10.3)
Chloride: 101 mmol/L (ref 96–106)
Creatinine, Ser: 1.28 mg/dL — ABNORMAL HIGH (ref 0.57–1.00)
Globulin, Total: 2.3 g/dL (ref 1.5–4.5)
Glucose: 107 mg/dL — ABNORMAL HIGH (ref 70–99)
Potassium: 3.9 mmol/L (ref 3.5–5.2)
Sodium: 143 mmol/L (ref 134–144)
Total Protein: 6.7 g/dL (ref 6.0–8.5)
eGFR: 42 mL/min/1.73 — ABNORMAL LOW (ref >59)

## 2023-12-15 LAB — HEMOGLOBIN A1C
Est. average glucose Bld gHb Est-mCnc: 108 mg/dL
Hgb A1c MFr Bld: 5.4 % (ref 4.8–5.6)

## 2023-12-17 ENCOUNTER — Ambulatory Visit: Payer: Self-pay | Admitting: Internal Medicine

## 2023-12-18 ENCOUNTER — Ambulatory Visit (INDEPENDENT_AMBULATORY_CARE_PROVIDER_SITE_OTHER): Admitting: Cardiovascular Disease

## 2023-12-18 ENCOUNTER — Encounter: Payer: Self-pay | Admitting: Cardiovascular Disease

## 2023-12-18 VITALS — BP 124/75 | HR 67 | Ht 66.0 in | Wt 172.6 lb

## 2023-12-18 DIAGNOSIS — E782 Mixed hyperlipidemia: Secondary | ICD-10-CM | POA: Diagnosis not present

## 2023-12-18 DIAGNOSIS — I10 Essential (primary) hypertension: Secondary | ICD-10-CM | POA: Diagnosis not present

## 2023-12-18 DIAGNOSIS — I340 Nonrheumatic mitral (valve) insufficiency: Secondary | ICD-10-CM

## 2023-12-18 DIAGNOSIS — I5032 Chronic diastolic (congestive) heart failure: Secondary | ICD-10-CM

## 2023-12-18 DIAGNOSIS — R7303 Prediabetes: Secondary | ICD-10-CM

## 2023-12-18 NOTE — Progress Notes (Signed)
 Cardiology Office Note   Date:  12/18/2023   ID:  [name], DOB 06/22/1940, MRN [id number]  PCP:  Fernand Fredy RAMAN, MD  Cardiologist:  Denyse Fernand, MD      History of Present Illness: [name] is a 83 y.o. female who presents for  Chief Complaint  Patient presents with   Follow-up    3 month follow up    Feels tired, but no chest pains or SOB.      Past Medical History:  Diagnosis Date   Actinic keratosis 12/19/2018   Right lat. antecubital area. Hypertrophic.   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 Excised: 07/10/2017     Past Surgical History:  Procedure Laterality Date   COLONOSCOPY N/A 08/06/2014   Procedure: COLONOSCOPY;  Surgeon: Lamar ONEIDA Holmes, MD;  Location: [facility] ENDOSCOPY;  Service: Endoscopy;  Laterality: N/A;   IR NEPHROSTOMY PLACEMENT LEFT  09/24/2017   LAPAROSCOPIC NEPHRECTOMY, HAND ASSISTED Left 11/05/2017   Procedure: HAND ASSISTED LAPAROSCOPIC NEPHRECTOMY;  Surgeon: Penne Knee, MD;  Location: [facility] ORS;  Service: Urology;  Laterality: Left;   NEPHROSTOMY TUBE REMOVAL Left 11/05/2017   Procedure: NEPHROSTOMY TUBE REMOVAL;  Surgeon: Penne Knee, MD;  Location: [facility] ORS;  Service: Urology;  Laterality: Left;   SHOULDER ARTHROSCOPY Right 2014   TONSILLECTOMY     TUBAL LIGATION       Current Outpatient Medications  Medication Sig Dispense Refill   acetaminophen  (TYLENOL ) 500 MG tablet Take 1,000 mg by mouth every 6 (six) hours as needed (for pain.).     Azelastine HCl 137 MCG/SPRAY SOLN 1-2 puffs in each nostril Nasally Twice a day for 30 day(s)     chlorthalidone (HYGROTON) 25 MG tablet TAKE 1 TABLET BY MOUTH EVERY DAY 90 tablet 1   cholecalciferol (VITAMIN D) 1000 UNITS tablet Take 1,000 Units by mouth at  bedtime.      dapagliflozin  propanediol (FARXIGA ) 10 MG TABS tablet Take 1 tablet (10 mg total) by mouth daily before breakfast. 90 tablet 3   levocetirizine (XYZAL ) 5 MG tablet Take 1 tablet (5 mg total) by mouth every evening. 90 tablet 3   metoprolol  succinate (TOPROL -XL) 100 MG 24 hr tablet TAKE 1 TABLET BY MOUTH EVERY DAY 90 tablet 3   mometasone  (ELOCON ) 0.1 % cream Apply to lesions on the R upper arm, and L thigh BID PRN. 45 g 1   rosuvastatin (CRESTOR) 20 MG tablet TAKE 1 TABLET BY MOUTH EVERY DAY 90 tablet 1   valsartan (DIOVAN) 160 MG tablet TAKE 1 TABLET BY MOUTH EVERY DAY 90 tablet 3   No current facility-administered medications for this visit.    Allergies:   Amlodipine, Adhesive [tape], and Raloxifene    Social History:   reports that she quit smoking about 48 years ago. Her smoking use included cigarettes. She has never used smokeless tobacco. She reports current alcohol use of about 7.0 standard drinks of alcohol per week. She reports that she does not use drugs.   Family History:  family history is not on file.    ROS:     Review of Systems  Constitutional: Negative.   HENT: Negative.    Eyes: Negative.   Respiratory:  Negative.    Gastrointestinal: Negative.   Genitourinary: Negative.   Musculoskeletal: Negative.   Skin: Negative.   Neurological: Negative.   Endo/Heme/Allergies: Negative.   Psychiatric/Behavioral: Negative.    All other systems reviewed and are negative.     All other systems are reviewed and negative.    PHYSICAL EXAM: VS:  BP 124/75   Pulse 67   Ht 5' 6 (1.676 m)   Wt 172 lb 9.6 oz (78.3 kg)   SpO2 94%   BMI 27.86 kg/m  , BMI Body mass index is 27.86 kg/m. Last weight:  Wt Readings from Last 3 Encounters:  12/18/23 172 lb 9.6 oz (78.3 kg)  12/14/23 171 lb 6.4 oz (77.7 kg)  09/13/23 171 lb (77.6 kg)     Physical Exam Constitutional:      Appearance: Normal appearance.  Cardiovascular:     Rate and Rhythm: Normal rate and  regular rhythm.     Heart sounds: Normal heart sounds.  Pulmonary:     Effort: Pulmonary effort is normal.     Breath sounds: Normal breath sounds.  Musculoskeletal:     Right lower leg: No edema.     Left lower leg: No edema.  Neurological:     Mental Status: She is alert.       EKG:   Recent Labs: 12/14/2023: ALT 28; BUN 28; Creatinine, Ser 1.28; Hemoglobin 13.2; Platelets 168; Potassium 3.9; Sodium 143    Lipid Panel    Component Value Date/Time   CHOL 147 12/14/2023 1006   TRIG 188 (H) 12/14/2023 1006   HDL 63 12/14/2023 1006   LDLCALC 54 12/14/2023 1006      Other studies Reviewed: Additional studies/ records that were reviewed today include:  Review of the above records demonstrates:       No data to display            ASSESSMENT AND PLAN:    ICD-10-CM   1. Chronic heart failure with preserved ejection fraction (HCC)  I50.32    continue farxiga , as feeling better.    2. Essential hypertension, benign  I10     3. Nonrheumatic mitral valve regurgitation  I34.0     4. Mixed hyperlipidemia  E78.2        Problem List Items Addressed This Visit       Cardiovascular and Mediastinum   Essential hypertension, benign   Chronic heart failure with preserved ejection fraction (HCC) - Primary   Nonrheumatic mitral valve regurgitation     Other   HLD (hyperlipidemia)       Disposition:   Return in about 3 months (around 03/18/2024).    Total time spent: 35 minutes  Signed,  Denyse Bathe, MD  12/18/2023 10:52 AM    [facility]

## 2023-12-19 ENCOUNTER — Telehealth: Payer: Self-pay | Admitting: Internal Medicine

## 2023-12-19 NOTE — Progress Notes (Signed)
 Patient notified

## 2023-12-19 NOTE — Telephone Encounter (Signed)
 Patient left VM stating she seen SK yesterday and Rosina was helping her get patient assistance for her Farxiga . She needs Rosina to call her back.

## 2023-12-24 NOTE — Telephone Encounter (Signed)
 Spoke with patient and she states she was approved  for the farxiga 

## 2024-01-09 ENCOUNTER — Encounter: Payer: Self-pay | Admitting: Dermatology

## 2024-01-09 ENCOUNTER — Ambulatory Visit: Payer: Medicare HMO | Admitting: Dermatology

## 2024-01-09 DIAGNOSIS — L814 Other melanin hyperpigmentation: Secondary | ICD-10-CM

## 2024-01-09 DIAGNOSIS — L821 Other seborrheic keratosis: Secondary | ICD-10-CM

## 2024-01-09 DIAGNOSIS — L720 Epidermal cyst: Secondary | ICD-10-CM

## 2024-01-09 DIAGNOSIS — D2261 Melanocytic nevi of right upper limb, including shoulder: Secondary | ICD-10-CM | POA: Diagnosis not present

## 2024-01-09 DIAGNOSIS — D489 Neoplasm of uncertain behavior, unspecified: Secondary | ICD-10-CM | POA: Diagnosis not present

## 2024-01-09 DIAGNOSIS — D229 Melanocytic nevi, unspecified: Secondary | ICD-10-CM

## 2024-01-09 DIAGNOSIS — Z1283 Encounter for screening for malignant neoplasm of skin: Secondary | ICD-10-CM

## 2024-01-09 DIAGNOSIS — W908XXA Exposure to other nonionizing radiation, initial encounter: Secondary | ICD-10-CM | POA: Diagnosis not present

## 2024-01-09 DIAGNOSIS — L820 Inflamed seborrheic keratosis: Secondary | ICD-10-CM

## 2024-01-09 DIAGNOSIS — L578 Other skin changes due to chronic exposure to nonionizing radiation: Secondary | ICD-10-CM | POA: Diagnosis not present

## 2024-01-09 DIAGNOSIS — D692 Other nonthrombocytopenic purpura: Secondary | ICD-10-CM

## 2024-01-09 DIAGNOSIS — Z8589 Personal history of malignant neoplasm of other organs and systems: Secondary | ICD-10-CM

## 2024-01-09 DIAGNOSIS — L729 Follicular cyst of the skin and subcutaneous tissue, unspecified: Secondary | ICD-10-CM

## 2024-01-09 NOTE — Patient Instructions (Addendum)
 Biopsy Wound Care Instructions  Leave the original bandage on for 24 hours if possible.  If the bandage becomes soaked or soiled before that time, it is OK to remove it and examine the wound.  A small amount of post-operative bleeding is normal.  If excessive bleeding occurs, remove the bandage, place gauze over the site and apply continuous pressure (no peeking) over the area for 30 minutes. If this does not work, please call our clinic as soon as possible or page your doctor if it is after hours.   Once a day, cleanse the wound with soap and water. It is fine to shower. If a thick crust develops you may use a Q-tip dipped into dilute hydrogen peroxide (mix 1:1 with water) to dissolve it.  Hydrogen peroxide can slow the healing process, so use it only as needed.    After washing, apply petroleum jelly (Vaseline) or an antibiotic ointment if your doctor prescribed one for you, followed by a bandage.    For best healing, the wound should be covered with a layer of ointment at all times. If you are not able to keep the area covered with a bandage to hold the ointment in place, this may mean re-applying the ointment several times a day.  Continue this wound care until the wound has healed and is no longer open.   Itching and mild discomfort is normal during the healing process. However, if you develop pain or severe itching, please call our office.   If you have any discomfort, you can take Tylenol  (acetaminophen ) or ibuprofen as directed on the bottle. (Please do not take these if you have an allergy to them or cannot take them for another reason).  Some redness, tenderness and white or yellow material in the wound is normal healing.  If the area becomes very sore and red, or develops a thick yellow-green material (pus), it may be infected; please notify us .    If you have stitches, return to clinic as directed to have the stitches removed. You will continue wound care for 2-3 days after the stitches  are removed.   Wound healing continues for up to one year following surgery. It is not unusual to experience pain in the scar from time to time during the interval.  If the pain becomes severe or the scar thickens, you should notify the office.    A slight amount of redness in a scar is expected for the first six months.  After six months, the redness will fade and the scar will soften and fade.  The color difference becomes less noticeable with time.  If there are any problems, return for a post-op surgery check at your earliest convenience.  To improve the appearance of the scar, you can use silicone scar gel, cream, or sheets (such as Mederma or Serica) every night for up to one year. These are available over the counter (without a prescription).  Please call our office at [phone] for any questions or concerns.    Electrodesiccation and Curettage ("Scrape and Burn") Wound Care Instructions  Leave the original bandage on for 24 hours if possible.  If the bandage becomes soaked or soiled before that time, it is OK to remove it and examine the wound.  A small amount of post-operative bleeding is normal.  If excessive bleeding occurs, remove the bandage, place gauze over the site and apply continuous pressure (no peeking) over the area for 30 minutes. If this does not work, please call  our clinic as soon as possible or page your doctor if it is after hours.   Once a day, cleanse the wound with soap and water. It is fine to shower. If a thick crust develops you may use a Q-tip dipped into dilute hydrogen peroxide (mix 1:1 with water) to dissolve it.  Hydrogen peroxide can slow the healing process, so use it only as needed.    After washing, apply petroleum jelly (Vaseline) or an antibiotic ointment if your doctor prescribed one for you, followed by a bandage.    For best healing, the wound should be covered with a layer of ointment at all times. If you are not able to keep the area covered  with a bandage to hold the ointment in place, this may mean re-applying the ointment several times a day.  Continue this wound care until the wound has healed and is no longer open. It may take several weeks for the wound to heal and close.  Itching and mild discomfort is normal during the healing process.  If you have any discomfort, you can take Tylenol  (acetaminophen ) or ibuprofen as directed on the bottle. (Please do not take these if you have an allergy to them or cannot take them for another reason).  Some redness, tenderness and white or yellow material in the wound is normal healing.  If the area becomes very sore and red, or develops a thick yellow-green material (pus), it may be infected; please notify us .    Wound healing continues for up to one year following surgery. It is not unusual to experience pain in the scar from time to time during the interval.  If the pain becomes severe or the scar thickens, you should notify the office.    A slight amount of redness in a scar is expected for the first six months.  After six months, the redness will fade and the scar will soften and fade.  The color difference becomes less noticeable with time.  If there are any problems, return for a post-op surgery check at your earliest convenience.  To improve the appearance of the scar, you can use silicone scar gel, cream, or sheets (such as Mederma or Serica) every night for up to one year. These are available over the counter (without a prescription).  Please call our office at [phone] for any questions or concerns.    Melanoma ABCDEs  Melanoma is the most dangerous type of skin cancer, and is the leading cause of death from skin disease.  You are more likely to develop melanoma if you: Have light-colored skin, light-colored eyes, or red or blond hair Spend a lot of time in the sun Tan regularly, either outdoors or in a tanning bed Have had blistering sunburns, especially during  childhood Have a close family member who has had a melanoma Have atypical moles or large birthmarks  Early detection of melanoma is key since treatment is typically straightforward and cure rates are extremely high if we catch it early.   The first sign of melanoma is often a change in a mole or a new dark spot.  The ABCDE system is a way of remembering the signs of melanoma.  A for asymmetry:  The two halves do not match. B for border:  The edges of the growth are irregular. C for color:  A mixture of colors are present instead of an even brown color. D for diameter:  Melanomas are usually (but not always) greater than 6mm -  the size of a pencil eraser. E for evolution:  The spot keeps changing in size, shape, and color.  Please check your skin once per month between visits. You can use a small mirror in front and a large mirror behind you to keep an eye on the back side or your body.   If you see any new or changing lesions before your next follow-up, please call to schedule a visit.  Please continue daily skin protection including broad spectrum sunscreen SPF 30+ to sun-exposed areas, reapplying every 2 hours as needed when you're outdoors.   Staying in the shade or wearing long sleeves, sun glasses (UVA+UVB protection) and wide brim hats (4-inch brim around the entire circumference of the hat) are also recommended for sun protection.    Due to recent changes in healthcare laws, you may see results of your pathology and/or laboratory studies on MyChart before the doctors have had a chance to review them. We understand that in some cases there may be results that are confusing or concerning to you. Please understand that not all results are received at the same time and often the doctors may need to interpret multiple results in order to provide you with the best plan of care or course of treatment. Therefore, we ask that you please give us  2 business days to thoroughly review all your  results before contacting the office for clarification. Should we see a critical lab result, you will be contacted sooner.   If You Need Anything After Your Visit  If you have any questions or concerns for your doctor, please call our main line at [phone] and press option 4 to reach your doctor's medical assistant. If no one answers, please leave a voicemail as directed and we will return your call as soon as possible. Messages left after 4 pm will be answered the following business day.   You may also send us  a message via MyChart. We typically respond to MyChart messages within 1-2 business days.  For prescription refills, please ask your pharmacy to contact our office. Our fax number is [phone].  If you have an urgent issue when the clinic is closed that cannot wait until the next business day, you can page your doctor at the number below.    Please note that while we do our best to be available for urgent issues outside of office hours, we are not available 24/7.   If you have an urgent issue and are unable to reach us , you may choose to seek medical care at your doctor's office, retail clinic, urgent care center, or emergency room.  If you have a medical emergency, please immediately call 911 or go to the emergency department.  Pager Numbers  - Dr. Hester: [phone]  - Dr. Jackquline: [phone]  - Dr. Claudene: [phone]   - Dr. Raymund: [phone]  In the event of inclement weather, please call our main line at [phone] for an update on the status of any delays or closures.  Dermatology Medication Tips: Please keep the boxes that topical medications come in in order to help keep track of the instructions about where and how to use these. Pharmacies typically print the medication instructions only on the boxes and not directly on the medication tubes.   If your medication is too expensive, please contact our office at [phone] option 4 or send us  a  message through MyChart.   We are unable to tell what your co-pay for medications will be in advance  as this is different depending on your insurance coverage. However, we may be able to find a substitute medication at lower cost or fill out paperwork to get insurance to cover a needed medication.   If a prior authorization is required to get your medication covered by your insurance company, please allow us  1-2 business days to complete this process.  Drug prices often vary depending on where the prescription is filled and some pharmacies may offer cheaper prices.  The website www.goodrx.com contains coupons for medications through different pharmacies. The prices here do not account for what the cost may be with help from insurance (it may be cheaper with your insurance), but the website can give you the price if you did not use any insurance.  - You can print the associated coupon and take it with your prescription to the pharmacy.  - You may also stop by our office during regular business hours and pick up a GoodRx coupon card.  - If you need your prescription sent electronically to a different pharmacy, notify our office through [facility] or by phone at [phone] option 4.     Si Usted Necesita Algo Despus de Su Visita  Tambin puede enviarnos un mensaje a travs de Clinical cytogeneticist. Por lo general respondemos a los mensajes de MyChart en el transcurso de 1 a 2 das hbiles.  Para renovar recetas, por favor pida a su farmacia que se ponga en contacto con nuestra oficina. Randi lakes de fax es [place] [phone].  Si tiene un asunto urgente cuando la clnica est cerrada y que no puede esperar hasta el siguiente da hbil, puede llamar/localizar a su doctor(a) al nmero que aparece a continuacin.   Por favor, tenga en cuenta que aunque hacemos todo lo posible para estar disponibles para asuntos urgentes fuera del horario de [place], no estamos disponibles las 24 horas del da, los 7  [address]  [address] de la [place].   Si tiene un problema urgente y no puede comunicarse con nosotros, puede optar por buscar atencin mdica  en el consultorio de su doctor(a), en una clnica privada, en un centro de atencin urgente o en una sala de emergencias.  Si tiene Engineer, drilling, por favor llame inmediatamente al 911 o vaya a la sala de emergencias.  Nmeros de bper  - Dr. Hester: [phone]  - Dra. Jackquline: [phone]  - Dr. Claudene: [phone]  - Dra. Kitts: [phone]  En caso de inclemencias del [place], por favor llame a nuestra lnea principal al [phone] para una actualizacin sobre el estado de cualquier retraso o cierre.  Consejos para la medicacin en dermatologa: Por favor, guarde las cajas en las que vienen los medicamentos de uso tpico para ayudarle a seguir las instrucciones sobre dnde y cmo usarlos. Las farmacias generalmente imprimen las instrucciones del medicamento slo en las cajas y no directamente en los tubos del [place].   Si su medicamento es muy caro, por favor, pngase en contacto con landry rieger llamando al [phone] y presione la opcin 4 o envenos un mensaje a travs de Clinical cytogeneticist.   No podemos decirle cul ser su copago por los medicamentos por adelantado ya que esto es diferente dependiendo de la cobertura de su seguro. Sin embargo, es posible que podamos encontrar un medicamento sustituto a Audiological scientist un formulario para que el seguro cubra el medicamento que se considera necesario.   Si se requiere una autorizacin previa para que su compaa de seguros malta su medicamento, por favor permtanos de  1 a 2 das hbiles para completar [address]  Los precios de los medicamentos varan con frecuencia dependiendo del Environmental consultant de dnde se surte la receta y alguna farmacias pueden ofrecer precios ms baratos.  El sitio web www.goodrx.com tiene cupones para medicamentos de Health and safety inspector. Los precios aqu no tienen en  cuenta lo que podra costar con la ayuda del seguro (puede ser ms barato con su seguro), pero el sitio web puede darle el precio si no utiliz Tourist information centre manager.  - Puede imprimir el cupn correspondiente y llevarlo con su receta a la farmacia.  - Tambin puede pasar por nuestra oficina durante el horario de atencin regular y Education officer, museum una tarjeta de cupones de GoodRx.  - Si necesita que su receta se enve electrnicamente a una farmacia diferente, informe a nuestra oficina a travs de MyChart de [place] o por telfono llamando al [phone] y presione la opcin 4.

## 2024-01-09 NOTE — Progress Notes (Signed)
 Follow-Up Visit   Subjective  [name] is a 83 y.o. female who presents for the following: Skin Cancer Screening and Full Body Skin Exam Hx of scc, hx of aks, hx of isks  The patient presents for Total-Body Skin Exam (TBSE) for skin cancer screening and mole check. The patient has spots, moles and lesions to be evaluated, some may be new or changing and the patient may have concern these could be cancer.  The following portions of the chart were reviewed this encounter and updated as appropriate: medications, allergies, medical history  Review of Systems:  No other skin or systemic complaints except as noted in HPI or Assessment and Plan.  Objective  Well appearing patient in no apparent distress; mood and affect are within normal limits.  A full examination was performed including scalp, head, eyes, ears, nose, lips, neck, chest, axillae, abdomen, back, buttocks, bilateral upper extremities, bilateral lower extremities, hands, feet, fingers, toes, fingernails, and toenails. All findings within normal limits unless otherwise noted below.   Relevant physical exam findings are noted in the Assessment and Plan.  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  - Call if any new or changing lesions are noted between office visits  SKIN CANCER SCREENING PERFORMED TODAY.  ACTINIC DAMAGE - Chronic condition, secondary to cumulative UV/sun exposure - diffuse scaly erythematous macules with underlying dyspigmentation - Recommend daily broad spectrum sunscreen SPF 30+ to sun-exposed areas,  reapply every 2 hours as needed.  - Staying in the shade or wearing long sleeves, sun glasses (UVA+UVB protection) and wide brim hats (4-inch brim around the entire circumference of the hat) are also recommended for sun protection.  - Call for new or changing lesions.  LENTIGINES, SEBORRHEIC KERATOSES, HEMANGIOMAS - Benign normal skin lesions - Benign-appearing - Call for any changes  MELANOCYTIC NEVI - Tan-brown and/or pink-flesh-colored symmetric macules and papules - Benign appearing on exam today - Observation - Call clinic for new or changing moles - Recommend daily use of broad spectrum spf 30+ sunscreen to sun-exposed areas.   EPIDERMAL INCLUSION CYST Exam: Subcutaneous nodule at right scapula 1.0 cm Benign-appearing. Exam most consistent with an epidermal inclusion cyst. Discussed that a cyst is a benign growth that can grow over time and sometimes get irritated or inflamed. Recommend observation if it is not bothersome. Discussed option of surgical excision to remove it if it is growing, symptomatic, or other changes noted. Please call for new or changing lesions so they can be evaluated.  Purpura - Chronic; persistent and recurrent.  Treatable, but not curable. - Violaceous macules and patches - Benign - Related to trauma, age, sun damage and/or use of blood thinners, chronic use of topical and/or oral steroids - Observe - Can use OTC arnica containing moisturizer such as Dermend Bruise Formula if desired - Call for worsening or other concerns  NEOPLASM OF UNCERTAIN BEHAVIOR Right Upper Arm - Anterior Epidermal / dermal shaving  Lesion diameter (cm):  1.5 Informed consent: discussed and consent obtained   Timeout: patient name, date of birth, surgical site, and procedure  verified   Procedure prep:  Patient was prepped and draped in usual sterile fashion Prep type:  Isopropyl alcohol Anesthesia: the lesion was anesthetized in a standard fashion   Anesthetic:  1%  lidocaine  w/ epinephrine 1-100,000 buffered w/ 8.4% NaHCO3 Instrument used: flexible razor blade   Hemostasis achieved with: pressure, aluminum chloride and electrodesiccation   Outcome: patient tolerated procedure well   Post-procedure details: sterile dressing applied and wound care instructions given   Dressing type: bandage and petrolatum    Destruction of lesion Complexity: extensive   Destruction method: electrodesiccation and curettage   Informed consent: discussed and consent obtained   Timeout:  patient name, date of birth, surgical site, and procedure verified Procedure prep:  Patient was prepped and draped in usual sterile fashion Prep type:  Isopropyl alcohol Anesthesia: the lesion was anesthetized in a standard fashion   Anesthetic:  1% lidocaine  w/ epinephrine 1-100,000 buffered w/ 8.4% NaHCO3 Curettage performed in three different directions: Yes   Electrodesiccation performed over the curetted area: Yes   Lesion length (cm):  1.5 Lesion width (cm):  1.5 Margin per side (cm):  0.2 Final wound size (cm):  1.9 Hemostasis achieved with:  pressure, aluminum chloride and electrodesiccation Outcome: patient tolerated procedure well with no complications   Post-procedure details: sterile dressing applied and wound care instructions given   Dressing type: bandage and petrolatum    Specimen 1 - Surgical pathology Differential Diagnosis: R/o isk vs scc  ED&C  Check Margins: no R/o isk vs scc  INFLAMED SEBORRHEIC KERATOSIS right scapula x 1 Symptomatic, irritating, patient would like treated. Destruction of lesion - right scapula x 1 Complexity: simple   Destruction method: cryotherapy   Informed consent: discussed and consent obtained   Timeout:  patient name, date of birth, surgical site, and procedure verified Lesion destroyed using liquid nitrogen: Yes   Region frozen until ice ball extended beyond lesion: Yes   Outcome: patient tolerated procedure well with no  complications   Post-procedure details: wound care instructions given    Related Medications mometasone  (ELOCON ) 0.1 % cream Apply to lesions on the R upper arm, and L thigh BID PRN. SKIN CANCER SCREENING   ACTINIC SKIN DAMAGE   HISTORY OF SQUAMOUS CELL CARCINOMA   LENTIGO   MELANOCYTIC NEVUS, UNSPECIFIED LOCATION   CYST OF SKIN   PURPURA   Return in about 1 year (around 01/08/2025) for TBSE.  IEleanor Blush, CMA, am acting as scribe for Alm Rhyme, MD.   Documentation: I have reviewed the above documentation for accuracy and completeness, and I agree with the above.  Alm Rhyme, MD

## 2024-01-14 ENCOUNTER — Ambulatory Visit: Payer: Self-pay | Admitting: Dermatology

## 2024-01-14 LAB — SURGICAL PATHOLOGY

## 2024-01-14 NOTE — Telephone Encounter (Addendum)
 Called and discussed bx results with patient. She verbalized understanding and denied further questions.   ----- Message from Alm Rhyme sent at 01/14/2024  4:58 PM EDT ----- FINAL DIAGNOSIS        1. Skin, right upper arm - anterior :       SEBORRHEIC KERATOSIS, IRRITATED, EXCORIATED OVERLYING MELANOCYTIC NEVUS,       INTRADERMAL TYPE    Benign mole with overlying benign keratosis Already treated No further treatment needed ----- Message ----- From: Interface, Lab In Three Zero One Sent: 01/14/2024   4:23 PM EDT To: Alm JAYSON Rhyme, MD

## 2024-01-21 ENCOUNTER — Ambulatory Visit: Admitting: Podiatry

## 2024-01-23 ENCOUNTER — Ambulatory Visit: Admitting: Podiatry

## 2024-01-23 ENCOUNTER — Encounter: Payer: Self-pay | Admitting: Podiatry

## 2024-01-23 DIAGNOSIS — L600 Ingrowing nail: Secondary | ICD-10-CM

## 2024-01-23 MED ORDER — NEOMYCIN-POLYMYXIN-HC 3.5-10000-1 OT SUSP: OTIC | 0 refills | Status: AC

## 2024-01-23 NOTE — Patient Instructions (Signed)

## 2024-01-24 ENCOUNTER — Encounter: Payer: Self-pay | Admitting: Podiatry

## 2024-01-24 NOTE — Progress Notes (Signed)
  Subjective:  Patient ID: [name], female    DOB: 10-11-1940,  MRN: [id number]  Chief Complaint  Patient presents with   Toe Pain    Est Great toe, right foot is sore . She feels like she has an ingrown nail on the medial boarder of the right great toe     83 y.o. female presents with the above complaint. History confirmed with patient.   Objective:  Physical Exam: warm, good capillary refill, no trophic changes or ulcerative lesions, normal DP and PT pulses, normal sensory exam, and ingrown R hallux medial border w/o paronychia  Assessment:   1. Ingrowing right great toenail      Plan:  Patient was evaluated and treated and all questions answered.    Ingrown Nail, right -Patient elects to proceed with minor surgery to remove ingrown toenail today. Consent reviewed and signed by patient. -Ingrown nail excised. See procedure note. -Educated on post-procedure care including soaking. Written instructions provided and reviewed. -Rx for Cortisporin sent to pharmacy. -Advised on signs and symptoms of infection developing.  We discussed that the phenol likely will create some redness and edema and tenderness around the nailbed as long as it is localized this is to be expected.  Will return as needed if any infection signs develop  Procedure: Excision of Ingrown Toenail Location: Right 1st toe medial nail borders. Anesthesia: Lidocaine  1% plain; 1.5 mL and Marcaine  0.5% plain; 1.5 mL, digital block. Skin Prep: Betadine. Dressing: Silvadene; telfa; dry, sterile, compression dressing. Technique: Following skin prep, the toe was exsanguinated and a tourniquet was secured at the base of the toe. The affected nail border was freed, split with a nail splitter, and excised. Chemical matrixectomy was then performed with phenol and irrigated out with alcohol. The tourniquet was then removed and sterile dressing applied. Disposition: Patient tolerated procedure well.    No  follow-ups on file.

## 2024-01-25 ENCOUNTER — Other Ambulatory Visit: Payer: Self-pay | Admitting: Family

## 2024-01-25 DIAGNOSIS — I10 Essential (primary) hypertension: Secondary | ICD-10-CM

## 2024-02-20 ENCOUNTER — Other Ambulatory Visit: Payer: Self-pay | Admitting: Internal Medicine

## 2024-02-20 DIAGNOSIS — I5032 Chronic diastolic (congestive) heart failure: Secondary | ICD-10-CM

## 2024-02-20 DIAGNOSIS — E782 Mixed hyperlipidemia: Secondary | ICD-10-CM

## 2024-02-20 DIAGNOSIS — I10 Essential (primary) hypertension: Secondary | ICD-10-CM

## 2024-02-21 ENCOUNTER — Other Ambulatory Visit: Payer: Self-pay | Admitting: Internal Medicine

## 2024-02-21 DIAGNOSIS — E782 Mixed hyperlipidemia: Secondary | ICD-10-CM

## 2024-03-11 NOTE — Progress Notes (Signed)
° °  03/11/2024  Patient ID: [name], female   DOB: 22-Apr-1940, 83 y.o.   MRN: [id number]  Pharmacy Quality Measure Review  This patient is appearing on a report for being at risk of failing the adherence measure for cholesterol (statin), diabetes, and hypertension (ACEi/ARB) medications this calendar year.   Medication: Rosuvastatin Last fill date: 02/22/24 for 90 day supply  Medication: Valsartan Last fill date: 02/22/24 for 90 day supply  Medication: Farxiga  Last fill date: 11/19/23 for 90 day supply  Insurance report was not up to date. No action needed at this time.  Farxiga  is now being shipped through [facility] and not going through insurance.  Jon VEAR [place], PharmD Clinical Pharmacist [phone]

## 2024-03-17 ENCOUNTER — Ambulatory Visit: Admitting: Cardiovascular Disease

## 2024-03-17 ENCOUNTER — Encounter: Payer: Self-pay | Admitting: Cardiovascular Disease

## 2024-03-17 VITALS — BP 124/78 | HR 62 | Ht 66.0 in | Wt 170.2 lb

## 2024-03-17 DIAGNOSIS — R0602 Shortness of breath: Secondary | ICD-10-CM | POA: Diagnosis not present

## 2024-03-17 DIAGNOSIS — I5032 Chronic diastolic (congestive) heart failure: Secondary | ICD-10-CM | POA: Diagnosis not present

## 2024-03-17 DIAGNOSIS — E782 Mixed hyperlipidemia: Secondary | ICD-10-CM

## 2024-03-17 DIAGNOSIS — I340 Nonrheumatic mitral (valve) insufficiency: Secondary | ICD-10-CM | POA: Diagnosis not present

## 2024-03-17 DIAGNOSIS — R000 Tachycardia, unspecified: Secondary | ICD-10-CM

## 2024-03-17 DIAGNOSIS — I10 Essential (primary) hypertension: Secondary | ICD-10-CM

## 2024-03-17 NOTE — Progress Notes (Signed)
 "     Cardiology Office Note   Date:  03/17/2024   ID:  Dalasia, [name] 1940/12/17, MRN [id number]  PCP:  Fernand Fredy RAMAN, MD  Cardiologist:  Denyse Fernand, MD      History of Present Illness: [name] is a 83 y.o. female who presents for  Chief Complaint  Patient presents with   Follow-up    3 month follow up    Np complaints..      Past Medical History:  Diagnosis Date   Actinic keratosis 12/19/2018   Right lat. antecubital area. Hypertrophic.   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 Excised: 07/10/2017     Past Surgical History:  Procedure Laterality Date   COLONOSCOPY N/A 08/06/2014   Procedure: COLONOSCOPY;  Surgeon: Lamar ONEIDA Holmes, MD;  Location: [facility] ENDOSCOPY;  Service: Endoscopy;  Laterality: N/A;   IR NEPHROSTOMY PLACEMENT LEFT  09/24/2017   LAPAROSCOPIC NEPHRECTOMY, HAND ASSISTED Left 11/05/2017   Procedure: HAND ASSISTED LAPAROSCOPIC NEPHRECTOMY;  Surgeon: Penne Knee, MD;  Location: [facility] ORS;  Service: Urology;  Laterality: Left;   NEPHROSTOMY TUBE REMOVAL Left 11/05/2017   Procedure: NEPHROSTOMY TUBE REMOVAL;  Surgeon: Penne Knee, MD;  Location: [facility] ORS;  Service: Urology;  Laterality: Left;   SHOULDER ARTHROSCOPY Right 2014   TONSILLECTOMY     TUBAL LIGATION       Current Outpatient Medications  Medication Sig Dispense Refill   acetaminophen  (TYLENOL ) 500 MG tablet Take 1,000 mg by mouth every 6 (six) hours as needed (for pain.).     Azelastine HCl 137 MCG/SPRAY SOLN 1-2 puffs in each nostril Nasally Twice a day for 30 day(s)     chlorthalidone (HYGROTON) 25 MG tablet TAKE 1 TABLET BY MOUTH EVERY DAY 90 tablet 1   cholecalciferol (VITAMIN D) 1000 UNITS tablet Take 1,000 Units by mouth at bedtime.      FARXIGA  10 MG  TABS tablet TAKE 1 TABLET BY MOUTH DAILY BEFORE BREAKFAST. 90 tablet 3   fluticasone  (FLONASE ) 50 MCG/ACT nasal spray Place 1 spray into both nostrils daily.     levocetirizine (XYZAL ) 5 MG tablet Take 1 tablet (5 mg total) by mouth every evening. 90 tablet 3   metoprolol  succinate (TOPROL -XL) 100 MG 24 hr tablet TAKE 1 TABLET BY MOUTH EVERY DAY 90 tablet 3   mometasone  (ELOCON ) 0.1 % cream Apply to lesions on the R upper arm, and L thigh BID PRN. 45 g 1   neomycin -polymyxin-hydrocortisone (CORTISPORIN) 3.5-10000-1 OTIC suspension Apply 1-2 drops daily after soaking and cover with bandaid 10 mL 0   rosuvastatin (CRESTOR) 20 MG tablet TAKE 1 TABLET BY MOUTH EVERY DAY 90 tablet 3   valsartan (DIOVAN) 160 MG tablet TAKE 1 TABLET BY MOUTH EVERY DAY 90 tablet 3   No current facility-administered medications for this visit.    Allergies:   Amlodipine, Adhesive [tape], and Raloxifene    Social History:   reports that she quit smoking about 49 years ago. Her smoking use included cigarettes. She has never used smokeless tobacco. She reports current alcohol use of about 7.0 standard drinks of alcohol per week. She reports that she does not use drugs.   Family History:  family history is  not on file.    ROS:     Review of Systems  Constitutional: Negative.   HENT: Negative.    Eyes: Negative.   Respiratory: Negative.    Gastrointestinal: Negative.   Genitourinary: Negative.   Musculoskeletal: Negative.   Skin: Negative.   Neurological: Negative.   Endo/Heme/Allergies: Negative.   Psychiatric/Behavioral: Negative.    All other systems reviewed and are negative.     All other systems are reviewed and negative.    PHYSICAL EXAM: VS:  BP 124/78   Pulse 62   Ht 5' 6 (1.676 m)   Wt 170 lb 3.2 oz (77.2 kg)   SpO2 95%   BMI 27.47 kg/m  , BMI Body mass index is 27.47 kg/m. Last weight:  Wt Readings from Last 3 Encounters:  03/17/24 170 lb 3.2 oz (77.2 kg)  12/18/23 172 lb 9.6 oz  (78.3 kg)  12/14/23 171 lb 6.4 oz (77.7 kg)     Physical Exam Constitutional:      Appearance: Normal appearance.  Cardiovascular:     Rate and Rhythm: Normal rate and regular rhythm.     Heart sounds: Normal heart sounds.  Pulmonary:     Effort: Pulmonary effort is normal.     Breath sounds: Normal breath sounds.  Musculoskeletal:     Right lower leg: No edema.     Left lower leg: No edema.  Neurological:     Mental Status: She is alert.       EKG:   Recent Labs: 12/14/2023: ALT 28; BUN 28; Creatinine, Ser 1.28; Hemoglobin 13.2; Platelets 168; Potassium 3.9; Sodium 143    Lipid Panel    Component Value Date/Time   CHOL 147 12/14/2023 1006   TRIG 188 (H) 12/14/2023 1006   HDL 63 12/14/2023 1006   LDLCALC 54 12/14/2023 1006      Other studies Reviewed: Additional studies/ records that were reviewed today include:  Review of the above records demonstrates:       No data to display            ASSESSMENT AND PLAN:    ICD-10-CM   1. Chronic heart failure with preserved ejection fraction (HCC)  I50.32    Compensated CHF. On GDMT , farxiga  helped SOB.    2. Mixed hyperlipidemia  E78.2     3. Essential hypertension, benign  I10     4. Nonrheumatic mitral valve regurgitation  I34.0     5. SOB (shortness of breath)  R06.02     6. Tachycardia  R00.0        Problem List Items Addressed This Visit       Cardiovascular and Mediastinum   Essential hypertension, benign   Chronic heart failure with preserved ejection fraction (HCC) - Primary   Nonrheumatic mitral valve regurgitation     Other   HLD (hyperlipidemia)   Other Visit Diagnoses       SOB (shortness of breath)         Tachycardia              Disposition:   Return in about 3 months (around 06/15/2024).    Total time spent: 35 minutes  Signed,  Denyse Bathe, MD  03/17/2024 9:57 AM    [facility] "

## 2024-04-17 ENCOUNTER — Ambulatory Visit: Admitting: Internal Medicine

## 2024-04-29 ENCOUNTER — Ambulatory Visit: Payer: Self-pay | Admitting: Internal Medicine

## 2024-04-29 ENCOUNTER — Encounter: Payer: Self-pay | Admitting: Internal Medicine

## 2024-04-29 VITALS — BP 142/88 | HR 59 | Ht 66.0 in | Wt 169.6 lb

## 2024-04-29 DIAGNOSIS — E782 Mixed hyperlipidemia: Secondary | ICD-10-CM | POA: Diagnosis not present

## 2024-04-29 DIAGNOSIS — I10 Essential (primary) hypertension: Secondary | ICD-10-CM | POA: Diagnosis not present

## 2024-04-29 DIAGNOSIS — Z1211 Encounter for screening for malignant neoplasm of colon: Secondary | ICD-10-CM | POA: Diagnosis not present

## 2024-04-29 DIAGNOSIS — R7303 Prediabetes: Secondary | ICD-10-CM | POA: Diagnosis not present

## 2024-04-29 DIAGNOSIS — I5032 Chronic diastolic (congestive) heart failure: Secondary | ICD-10-CM | POA: Diagnosis not present

## 2024-04-30 ENCOUNTER — Ambulatory Visit: Payer: Self-pay | Admitting: Internal Medicine

## 2024-04-30 LAB — CBC WITH DIFFERENTIAL/PLATELET
Basophils Absolute: 0 10*3/uL (ref 0.0–0.2)
Basos: 1 %
EOS (ABSOLUTE): 0.2 10*3/uL (ref 0.0–0.4)
Eos: 3 %
Hematocrit: 40.9 % (ref 34.0–46.6)
Hemoglobin: 13.6 g/dL (ref 11.1–15.9)
Immature Grans (Abs): 0 10*3/uL (ref 0.0–0.1)
Immature Granulocytes: 0 %
Lymphocytes Absolute: 1.4 10*3/uL (ref 0.7–3.1)
Lymphs: 25 %
MCH: 31.3 pg (ref 26.6–33.0)
MCHC: 33.3 g/dL (ref 31.5–35.7)
MCV: 94 fL (ref 79–97)
Monocytes Absolute: 0.5 10*3/uL (ref 0.1–0.9)
Monocytes: 10 %
Neutrophils Absolute: 3.5 10*3/uL (ref 1.4–7.0)
Neutrophils: 61 %
Platelets: 165 10*3/uL (ref 150–450)
RBC: 4.35 x10E6/uL (ref 3.77–5.28)
RDW: 12.3 % (ref 11.7–15.4)
WBC: 5.6 10*3/uL (ref 3.4–10.8)

## 2024-04-30 LAB — LIPID PANEL W/O CHOL/HDL RATIO
Cholesterol, Total: 159 mg/dL (ref 100–199)
HDL: 65 mg/dL
LDL Chol Calc (NIH): 65 mg/dL (ref 0–99)
Triglycerides: 177 mg/dL — ABNORMAL HIGH (ref 0–149)
VLDL Cholesterol Cal: 29 mg/dL (ref 5–40)

## 2024-04-30 LAB — CMP14+EGFR
ALT: 25 [IU]/L (ref 0–32)
AST: 27 [IU]/L (ref 0–40)
Albumin: 4.2 g/dL (ref 3.7–4.7)
Alkaline Phosphatase: 74 [IU]/L (ref 48–129)
BUN/Creatinine Ratio: 20 (ref 12–28)
BUN: 24 mg/dL (ref 8–27)
Bilirubin Total: 0.6 mg/dL (ref 0.0–1.2)
CO2: 25 mmol/L (ref 20–29)
Calcium: 9.4 mg/dL (ref 8.7–10.3)
Chloride: 104 mmol/L (ref 96–106)
Creatinine, Ser: 1.21 mg/dL — ABNORMAL HIGH (ref 0.57–1.00)
Globulin, Total: 2.4 g/dL (ref 1.5–4.5)
Glucose: 107 mg/dL — ABNORMAL HIGH (ref 70–99)
Potassium: 3.8 mmol/L (ref 3.5–5.2)
Sodium: 146 mmol/L — ABNORMAL HIGH (ref 134–144)
Total Protein: 6.6 g/dL (ref 6.0–8.5)
eGFR: 45 mL/min/{1.73_m2} — ABNORMAL LOW

## 2024-04-30 LAB — HEMOGLOBIN A1C
Est. average glucose Bld gHb Est-mCnc: 123 mg/dL
Hgb A1c MFr Bld: 5.9 % — ABNORMAL HIGH (ref 4.8–5.6)

## 2024-04-30 NOTE — Progress Notes (Signed)
 Patient notified

## 2024-05-21 ENCOUNTER — Other Ambulatory Visit

## 2024-07-08 ENCOUNTER — Ambulatory Visit: Admitting: Cardiovascular Disease

## 2024-08-28 ENCOUNTER — Ambulatory Visit: Admitting: Internal Medicine

## 2024-08-29 ENCOUNTER — Ambulatory Visit: Admitting: Internal Medicine

## 2025-01-12 ENCOUNTER — Ambulatory Visit: Admitting: Dermatology
